# Patient Record
Sex: Female | Born: 1980 | Race: White | Hispanic: No | Marital: Single | Smoking: Current some day smoker
Health system: Southern US, Community
[De-identification: ages and names within clinical notes are randomized; demographics above are authoritative.]

## PROBLEM LIST (undated history)

## (undated) ENCOUNTER — Inpatient Hospital Stay (HOSPITAL_COMMUNITY): Payer: Self-pay

## (undated) DIAGNOSIS — J302 Other seasonal allergic rhinitis: Secondary | ICD-10-CM

## (undated) DIAGNOSIS — Z9889 Other specified postprocedural states: Secondary | ICD-10-CM

## (undated) DIAGNOSIS — Z8614 Personal history of Methicillin resistant Staphylococcus aureus infection: Secondary | ICD-10-CM

## (undated) DIAGNOSIS — M26629 Arthralgia of temporomandibular joint, unspecified side: Secondary | ICD-10-CM

## (undated) DIAGNOSIS — G5601 Carpal tunnel syndrome, right upper limb: Secondary | ICD-10-CM

## (undated) DIAGNOSIS — Z87442 Personal history of urinary calculi: Secondary | ICD-10-CM

## (undated) DIAGNOSIS — R203 Hyperesthesia: Secondary | ICD-10-CM

## (undated) DIAGNOSIS — R112 Nausea with vomiting, unspecified: Secondary | ICD-10-CM

## (undated) HISTORY — PX: TONSILLECTOMY: SUR1361

## (undated) HISTORY — PX: THERAPEUTIC ABORTION: SHX798

## (undated) HISTORY — PX: LUMBAR DISC SURGERY: SHX700

---
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
## 1999-07-06 ENCOUNTER — Other Ambulatory Visit: Admission: RE | Admit: 1999-07-06 | Discharge: 1999-07-06 | Payer: Self-pay | Admitting: Obstetrics and Gynecology

## 2000-05-12 ENCOUNTER — Encounter: Payer: Self-pay | Admitting: Emergency Medicine

## 2000-05-12 ENCOUNTER — Emergency Department (HOSPITAL_COMMUNITY): Admission: EM | Admit: 2000-05-12 | Discharge: 2000-05-12 | Payer: Self-pay | Admitting: Emergency Medicine

## 2000-08-28 ENCOUNTER — Other Ambulatory Visit: Admission: RE | Admit: 2000-08-28 | Discharge: 2000-08-28 | Payer: Self-pay | Admitting: Obstetrics and Gynecology

## 2001-09-07 ENCOUNTER — Other Ambulatory Visit: Admission: RE | Admit: 2001-09-07 | Discharge: 2001-09-07 | Payer: Self-pay | Admitting: Obstetrics and Gynecology

## 2002-10-12 ENCOUNTER — Other Ambulatory Visit: Admission: RE | Admit: 2002-10-12 | Discharge: 2002-10-12 | Payer: Self-pay | Admitting: Obstetrics and Gynecology

## 2004-06-18 ENCOUNTER — Other Ambulatory Visit: Admission: RE | Admit: 2004-06-18 | Discharge: 2004-06-18 | Payer: Self-pay | Admitting: Obstetrics and Gynecology

## 2004-07-17 ENCOUNTER — Emergency Department (HOSPITAL_COMMUNITY): Admission: AC | Admit: 2004-07-17 | Discharge: 2004-07-18 | Payer: Self-pay

## 2004-07-20 ENCOUNTER — Emergency Department (HOSPITAL_COMMUNITY): Admission: EM | Admit: 2004-07-20 | Discharge: 2004-07-20 | Payer: Self-pay | Admitting: Family Medicine

## 2004-07-23 ENCOUNTER — Emergency Department (HOSPITAL_COMMUNITY): Admission: EM | Admit: 2004-07-23 | Discharge: 2004-07-23 | Payer: Self-pay | Admitting: Family Medicine

## 2004-07-24 ENCOUNTER — Ambulatory Visit (HOSPITAL_BASED_OUTPATIENT_CLINIC_OR_DEPARTMENT_OTHER): Admission: RE | Admit: 2004-07-24 | Discharge: 2004-07-24 | Payer: Self-pay | Admitting: Orthopedic Surgery

## 2004-07-24 HISTORY — PX: ORIF METACARPAL FRACTURE: SUR940

## 2004-08-01 ENCOUNTER — Emergency Department (HOSPITAL_COMMUNITY): Admission: EM | Admit: 2004-08-01 | Discharge: 2004-08-01 | Payer: Self-pay | Admitting: Family Medicine

## 2005-06-13 ENCOUNTER — Other Ambulatory Visit: Admission: RE | Admit: 2005-06-13 | Discharge: 2005-06-13 | Payer: Self-pay | Admitting: Obstetrics and Gynecology

## 2006-03-24 ENCOUNTER — Emergency Department (HOSPITAL_COMMUNITY): Admission: EM | Admit: 2006-03-24 | Discharge: 2006-03-24 | Payer: Self-pay | Admitting: Emergency Medicine

## 2006-04-18 ENCOUNTER — Emergency Department (HOSPITAL_COMMUNITY): Admission: EM | Admit: 2006-04-18 | Discharge: 2006-04-18 | Payer: Self-pay | Admitting: Family Medicine

## 2006-05-15 ENCOUNTER — Emergency Department (HOSPITAL_COMMUNITY): Admission: EM | Admit: 2006-05-15 | Discharge: 2006-05-15 | Payer: Self-pay | Admitting: Emergency Medicine

## 2006-11-17 ENCOUNTER — Emergency Department (HOSPITAL_COMMUNITY): Admission: EM | Admit: 2006-11-17 | Discharge: 2006-11-18 | Payer: Self-pay | Admitting: Obstetrics and Gynecology

## 2006-11-18 ENCOUNTER — Inpatient Hospital Stay (HOSPITAL_COMMUNITY): Admission: EM | Admit: 2006-11-18 | Discharge: 2006-11-21 | Payer: Self-pay | Admitting: Psychiatry

## 2006-11-18 ENCOUNTER — Ambulatory Visit: Payer: Self-pay | Admitting: Psychiatry

## 2007-10-21 ENCOUNTER — Encounter (INDEPENDENT_AMBULATORY_CARE_PROVIDER_SITE_OTHER): Payer: Self-pay | Admitting: Family Medicine

## 2007-10-21 ENCOUNTER — Ambulatory Visit: Payer: Self-pay | Admitting: Internal Medicine

## 2007-10-21 LAB — CONVERTED CEMR LAB
ALT: 9 units/L (ref 0–35)
AST: 10 units/L (ref 0–37)
Albumin: 4.9 g/dL (ref 3.5–5.2)
Alkaline Phosphatase: 49 units/L (ref 39–117)
BUN: 16 mg/dL (ref 6–23)
Basophils Absolute: 0 10*3/uL (ref 0.0–0.1)
Basophils Relative: 1 % (ref 0–1)
CO2: 22 meq/L (ref 19–32)
Calcium: 10.1 mg/dL (ref 8.4–10.5)
Chloride: 106 meq/L (ref 96–112)
Creatinine, Ser: 0.79 mg/dL (ref 0.40–1.20)
Eosinophils Absolute: 0.1 10*3/uL (ref 0.0–0.7)
Eosinophils Relative: 2 % (ref 0–5)
Glucose, Bld: 89 mg/dL (ref 70–99)
HCT: 42.4 % (ref 36.0–46.0)
Hemoglobin: 14.6 g/dL (ref 12.0–15.0)
Lymphocytes Relative: 36 % (ref 12–46)
Lymphs Abs: 2.4 10*3/uL (ref 0.7–4.0)
MCHC: 34.4 g/dL (ref 30.0–36.0)
MCV: 91.8 fL (ref 78.0–100.0)
Monocytes Absolute: 0.6 10*3/uL (ref 0.1–1.0)
Monocytes Relative: 8 % (ref 3–12)
Neutro Abs: 3.5 10*3/uL (ref 1.7–7.7)
Neutrophils Relative %: 53 % (ref 43–77)
Platelets: 208 10*3/uL (ref 150–400)
Potassium: 4.6 meq/L (ref 3.5–5.3)
RBC: 4.62 M/uL (ref 3.87–5.11)
RDW: 13.1 % (ref 11.5–15.5)
Sodium: 140 meq/L (ref 135–145)
TSH: 1.448 microintl units/mL (ref 0.350–5.50)
Total Bilirubin: 0.3 mg/dL (ref 0.3–1.2)
Total Protein: 7.2 g/dL (ref 6.0–8.3)
WBC: 6.6 10*3/uL (ref 4.0–10.5)

## 2007-12-02 ENCOUNTER — Ambulatory Visit: Payer: Self-pay | Admitting: Internal Medicine

## 2007-12-02 ENCOUNTER — Encounter: Payer: Self-pay | Admitting: Family Medicine

## 2007-12-02 LAB — CONVERTED CEMR LAB
Chlamydia, DNA Probe: NEGATIVE
GC Probe Amp, Genital: NEGATIVE

## 2007-12-15 ENCOUNTER — Emergency Department (HOSPITAL_COMMUNITY): Admission: EM | Admit: 2007-12-15 | Discharge: 2007-12-15 | Payer: Self-pay | Admitting: Emergency Medicine

## 2007-12-16 ENCOUNTER — Ambulatory Visit: Payer: Self-pay | Admitting: *Deleted

## 2008-01-04 ENCOUNTER — Ambulatory Visit: Payer: Self-pay | Admitting: Internal Medicine

## 2008-01-11 ENCOUNTER — Ambulatory Visit: Payer: Self-pay | Admitting: Internal Medicine

## 2008-02-25 ENCOUNTER — Emergency Department (HOSPITAL_COMMUNITY): Admission: EM | Admit: 2008-02-25 | Discharge: 2008-02-25 | Payer: Self-pay | Admitting: Family Medicine

## 2008-03-08 ENCOUNTER — Ambulatory Visit: Payer: Self-pay | Admitting: Internal Medicine

## 2008-03-28 ENCOUNTER — Emergency Department (HOSPITAL_COMMUNITY): Admission: EM | Admit: 2008-03-28 | Discharge: 2008-03-28 | Payer: Self-pay | Admitting: Family Medicine

## 2008-04-27 ENCOUNTER — Ambulatory Visit: Payer: Self-pay | Admitting: Internal Medicine

## 2008-10-07 ENCOUNTER — Ambulatory Visit: Payer: Self-pay | Admitting: Internal Medicine

## 2008-11-01 ENCOUNTER — Emergency Department (HOSPITAL_COMMUNITY): Admission: EM | Admit: 2008-11-01 | Discharge: 2008-11-01 | Payer: Self-pay | Admitting: Family Medicine

## 2008-12-01 ENCOUNTER — Encounter: Payer: Self-pay | Admitting: Family Medicine

## 2008-12-01 ENCOUNTER — Ambulatory Visit: Payer: Self-pay | Admitting: Internal Medicine

## 2008-12-27 ENCOUNTER — Emergency Department (HOSPITAL_COMMUNITY): Admission: EM | Admit: 2008-12-27 | Discharge: 2008-12-27 | Payer: Self-pay | Admitting: Family Medicine

## 2009-07-07 ENCOUNTER — Emergency Department (HOSPITAL_COMMUNITY): Admission: EM | Admit: 2009-07-07 | Discharge: 2009-07-07 | Payer: Self-pay | Admitting: Emergency Medicine

## 2009-11-13 ENCOUNTER — Emergency Department (HOSPITAL_COMMUNITY): Admission: EM | Admit: 2009-11-13 | Discharge: 2009-11-13 | Payer: Self-pay | Admitting: Family Medicine

## 2010-09-25 ENCOUNTER — Other Ambulatory Visit: Payer: Self-pay | Admitting: Surgery

## 2010-11-06 ENCOUNTER — Other Ambulatory Visit: Payer: Self-pay | Admitting: Obstetrics and Gynecology

## 2010-12-17 ENCOUNTER — Emergency Department (INDEPENDENT_AMBULATORY_CARE_PROVIDER_SITE_OTHER): Payer: BC Managed Care – PPO

## 2010-12-17 ENCOUNTER — Emergency Department (HOSPITAL_BASED_OUTPATIENT_CLINIC_OR_DEPARTMENT_OTHER)
Admission: EM | Admit: 2010-12-17 | Discharge: 2010-12-17 | Disposition: A | Discharge status: Home / Self Care | Payer: BC Managed Care – PPO | Attending: Emergency Medicine | Admitting: Emergency Medicine

## 2010-12-17 DIAGNOSIS — M545 Low back pain, unspecified: Secondary | ICD-10-CM | POA: Insufficient documentation

## 2010-12-17 DIAGNOSIS — N200 Calculus of kidney: Secondary | ICD-10-CM

## 2010-12-17 DIAGNOSIS — Z87442 Personal history of urinary calculi: Secondary | ICD-10-CM

## 2010-12-17 DIAGNOSIS — R109 Unspecified abdominal pain: Secondary | ICD-10-CM

## 2010-12-17 LAB — PREGNANCY, URINE: Preg Test, Ur: NEGATIVE

## 2010-12-17 LAB — URINALYSIS, ROUTINE W REFLEX MICROSCOPIC
Hgb urine dipstick: NEGATIVE
Ketones, ur: NEGATIVE mg/dL
Nitrite: NEGATIVE
Protein, ur: NEGATIVE mg/dL
Urobilinogen, UA: 0.2 mg/dL (ref 0.0–1.0)
pH: 7 (ref 5.0–8.0)
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
## 2010-12-21 NOTE — H&P (Signed)
Paula Sosa, Paula Sosa                ACCOUNT NO.:  [id number]   MEDICAL RECORD NO.:  [id number]          PATIENT TYPE:  IPS   LOCATION:  0505                          [facility]ILITY[facility]   PHYSICIAN:  Vic Ripper, P.A.-C.DATE OF BIRTH:  1981-02-06   DATE OF ADMISSION:  11/18/2006  DATE OF DISCHARGE:                       PSYCHIATRIC ADMISSION ASSESSMENT   PSYCHIATRIC ADMISSION ASSESSMENT:  This is a voluntary admission to the  services of Dr. Geoffery Lyons.   IDENTIFYING INFORMATION:  This is a 30 year old single white female.  She presented to the emergency department at [facility]  requesting detox from alcohol, cocaine and benzodiazepines.  She stated  that she had last drank about 6 p.m. yesterday, she usually drinks 3-4  beers and 6-7 shots every night.  She stated that she is not sure if she  has a chemical imbalance because she began using drugs and alcohol at  age 53.  Yesterday she realized that she does not own anything, she does  not have any prospects of a future, and she called her mother and stated  that she was ready to get help.  A cousin of hers had gone to the  program at [facility] back in the 1980s, and they went over to  [employer] and interviewed.  They were advised that she needed to  be detoxed, and hence she came here to [facility].   PAST PSYCHIATRIC HISTORY:  She attempted outpatient counseling in 2005  at the [facility].  She did have several trials of SSRIs; however,  she did not take them and she was drinking at the time, so she never  felt that they were helpful.  Three months ago she had some outpatient  therapy with a Vernell Leep.   SOCIAL HISTORY:  She graduated high school in 1998.  She never married.  She has no children.  She waits tables.   FAMILY HISTORY:  Her father and paternal grandfather had substance abuse  issues.   Her own alcohol and drug history begins at age 44.  She began using  alcohol.  She currently  drinks as described above.  She also began using  cocaine and benzodiazepines at age 29.  She is currently using cocaine  about four times in the past 2-1/2 months, and Xanax she generally uses  three Xanax 2 mg a day, so that is a pretty high dose.   PAST MEDICAL HISTORY:  Primary care Verdis Bassette is Dr. Catha Gosselin.  Medical problems:  She has chronic back pain and kidney stones.   MEDICATIONS:  None.   DRUG ALLERGIES:  PENICILLIN.   POSITIVE PHYSICAL FINDINGS:  She was medically cleared in the ED.  She  had no remarkable physical findings other than a slightly elevated  glucose at 123.  She does admit to not eating correctly.  Her vital  signs on admission show she is 68 inches tall, weighs 141, temperature  97.7, blood pressure 121/79, pulse 71-80, respirations 18.  She has five  tattoos.  Please see anatomic drawing.  Left hand:  She had to have six  screws  in 2005.  She has had a tonsillectomy.  She has also been treated  for genital warts two years ago.   MENTAL STATUS EXAMINATION:  Today she is alert and oriented x3.  She is  casually dressed, appropriately groomed and nourished.  Her mood is  appropriate to the situation.  Her affect is normal range.  Thought  processes are clear, rational and goal oriented.  Judgment and insight  are intact.  Concentration and memory are intact.  Intelligence is at  least average.  She is denying suicidal or homicidal ideation.  She is  denying auditory or visual hallucinations.   DIAGNOSES:  AXIS I:  Depressive disorder, not other specified;  polysubstance abuse.  AXIS II:  Deferred.  AXIS III:  Chronic back pain and currently has an elevated blood sugar.  AXIS IV:  Primary support and financial.  AXIS V:  35.   PLAN:  To admit to support through detoxification.  Toward that end she  was given Librium 25 mg p.o. q.6h. p.r.n. withdrawal, but given her  substantial Xanax usage, we may need to have clonidine added in later  on.  Currently  she is not exhibiting any withdrawal.  Once she is  through with withdrawal, she plans to be admitted to the [facility].  She has already had an entry interview.      Vic Ripper, P.A.-C.     MD/MEDQ  D:  11/18/2006  T:  11/18/2006  Job:  [id number]

## 2010-12-21 NOTE — Discharge Summary (Signed)
Sosa, Paula                ACCOUNT NO.:  [id number]   MEDICAL RECORD NO.:  [id number]          PATIENT TYPE:  IPS   LOCATION:  0505                          FACILITY:  [facility]   PHYSICIAN:  Geoffery Lyons, M.D.      DATE OF BIRTH:  [birth year]   DATE OF ADMISSION:  11/18/2006  DATE OF DISCHARGE:  11/21/2006                               DISCHARGE SUMMARY   CHIEF COMPLAINT AND PRESENT ILLNESS:  This was the first admission to  [facility] for this 30 year old single white female  who presented to the emergency department at [facility] requesting  detox from alcohol, cocaine and benzodiazepines.  She has had last drink  about 6 p.m. the day before this admission, usually drinking 3-4 beers  and 6-7 shots every night.  Stated that she was not sure if she had a  chemical imbalance because she began using drugs and alcohol at age 2.  Realized she did not own anything.  Does not have any prospect of a  future.  She called her mother and stated that she was ready to get  help.  A cousin of hers has gone through [employer] back in the  80s.  They went to [employer] for an interview.  She was advised  she needed to be detoxed first before being considered for their  program.   PAST PSYCHIATRIC HISTORY:  Attempted outpatient counseling in 2005 at  the [facility].  Several trials of SSRIs.  Did not take them.  She  was drinking at that time.   ALCOHOL/DRUG HISTORY:  Persistent use of alcohol.  Also began using  cocaine and benzodiazepines at age 1.  Using cocaine four times in the  past 2-1/2 months and uses 3 Xanax 2 mg a day.  Not prescribed.   MEDICAL HISTORY:  Chronic back pain and kidney stone.   MEDICATIONS:  None.   PHYSICAL EXAMINATION:  Performed and failed to show any acute findings.   LABORATORY WORK:  Glucose 108, hemoglobin A1C 5.5.   MENTAL STATUS EXAM:  Fully alert cooperative female casually dressed,  appropriately groomed and nourished.   Mood is appropriate.  Mood is  anxious.  Affect is anxious.  Thought processes are clear, rational and  goal-oriented.  No evidence of delusions.  No active suicidal or  homicidal ideation.  No hallucinations.  Cognition well-preserved.   ADMISSION DIAGNOSES:  AXIS I:  Depressive disorder not otherwise  specified.  Alcohol and benzodiazepine dependence.  Cocaine abuse.  AXIS II:  No diagnosis.  AXIS III:  Chronic back pain.  AXIS IV:  Moderate.  AXIS V:  GAF upon admission 35; highest GAF in the last year 65.   HOSPITAL COURSE:  She was admitted.  She was started in individual and  group psychotherapy.  She was detoxified with Librium.  She endorsed  using alcohol and drugs for 10 years, on and off, cocaine, pain pills,  Xanax, alcohol.  Told her mother she could not do it anymore.  Sunday  night, got all messed up.  At 30 years old,  started using cocaine and  Xanax.  Living with roommates in [place].  Parents in [place].  Works in bars, [employer].  In 2004 at the [facility].  Has seen Tiana Loft for depression.  She requests admission to [facility] and  she was asked to be detoxed before they were considering her.  So we  pursued the detoxification.  On November 20, 2006, she learned that she was  going to be accepted.  Felt that she was not going to able to make it  just by going to a [facility] IOP.  Felt that she needed the structure and there  was a previous family member who did well on the [employer].  On November 21, 2006, she was in full contact with reality.  There were no active suicidal or homicidal ideation.  No hallucinations.  No delusions.  First, she said she was ready to go, was going to go  directly to the street, worked towards getting admitted there, will  pursue long-term treatment for addiction.  The goal is long-term  abstinence recovery.   DISCHARGE DIAGNOSES:  AXIS I:  Alcohol and benzodiazepine dependence.  Cocaine abuse.  Depressive  disorder not otherwise specified.  AXIS II:  No diagnosis.  AXIS III:  History of chronic back pain.  AXIS IV:  Moderate.  AXIS V:  GAF upon discharge 50-55.   DISCHARGE MEDICATIONS:  None.   FOLLOWUP:  To pursue residential treatment through [employer]  program.      Geoffery Lyons, M.D.  Electronically Signed     IL/MEDQ  D:  12/17/2006  T:  12/17/2006  Job:  [id number]

## 2011-04-21 ENCOUNTER — Encounter: Payer: Self-pay | Admitting: *Deleted

## 2011-04-21 ENCOUNTER — Emergency Department (HOSPITAL_BASED_OUTPATIENT_CLINIC_OR_DEPARTMENT_OTHER)
Admission: EM | Admit: 2011-04-21 | Discharge: 2011-04-21 | Disposition: A | Discharge status: Home / Self Care | Payer: Self-pay | Attending: Emergency Medicine | Admitting: Emergency Medicine

## 2011-04-21 DIAGNOSIS — F329 Major depressive disorder, single episode, unspecified: Secondary | ICD-10-CM | POA: Insufficient documentation

## 2011-04-21 DIAGNOSIS — F3289 Other specified depressive episodes: Secondary | ICD-10-CM | POA: Insufficient documentation

## 2011-04-21 DIAGNOSIS — M25529 Pain in unspecified elbow: Secondary | ICD-10-CM | POA: Insufficient documentation

## 2011-04-21 DIAGNOSIS — M7710 Lateral epicondylitis, unspecified elbow: Secondary | ICD-10-CM | POA: Insufficient documentation

## 2011-04-21 MED ORDER — IBUPROFEN 400 MG PO TABS
600.0000 mg | ORAL_TABLET | Freq: Once | ORAL | Status: AC
  Administered 2011-04-21: 600 mg via ORAL
  Filled 2011-04-21: qty 1

## 2011-04-21 MED ORDER — PREDNISONE 20 MG PO TABS
60.0000 mg | ORAL_TABLET | Freq: Once | ORAL | Status: AC
  Administered 2011-04-21: 60 mg via ORAL
  Filled 2011-04-21: qty 3

## 2011-04-21 NOTE — ED Provider Notes (Signed)
History     CSN: [id number] Arrival date & time: 04/21/2011  3:02 PM Scribed for Hilario Quarry, MD, the patient was seen in room [facility]1/[facility]. This chart was scribed by Katha Cabal.    Chief Complaint  Patient presents with  . Elbow Injury    HPI  Paula Sosa is a 30 y.o. female who presents to the Emergency Department complaining of gradual worsening of right elbow pain since onset yesterday.  Denies injury.  Patient states that she was helping her parents move and was lifting objects frequently.  No pain medications where taken at home.  Ice provided by ED RN is providing some relief.   PCP [facility]    PAST MEDICAL HISTORY:  Past Medical History  Diagnosis Date  . Depression     PAST SURGICAL HISTORY:  Past Surgical History  Procedure Date  . Hand surgery     FAMILY HISTORY:  No family history on file.   SOCIAL HISTORY: History   Social History  . Marital Status: Single    Spouse Name: N/A    Number of Children: N/A  . Years of Education: N/A   Social History Main Topics  . Smoking status: Never Smoker   . Smokeless tobacco: None  . Alcohol Use: No  . Drug Use: Yes  . Sexually Active: Yes    Birth Control/ Protection: None   Other Topics Concern  . None   Social History Narrative  . None    Review of Systems 10 Systems reviewed and are negative for acute change except as noted in the HPI.  Allergies  Penicillins  Home Medications   Current Outpatient Rx  Name Route Sig Dispense Refill  . BUPROPION HCL (XL) 300 MG PO TB24 Oral Take 300 mg by mouth daily.      . IBUPROFEN 200 MG PO TABS Oral Take 400 mg by mouth every 6 (six) hours as needed. pain     . SERTRALINE HCL 100 MG PO TABS Oral Take 100 mg by mouth daily.      Marland Kitchen VISINE OP Both Eyes Place 1 drop into both eyes daily as needed. Itchy eyes       Physical Exam    BP 126/76  Pulse 106  Temp(Src) 98.6 F (37 C) (Oral)  Resp 20  Ht 5\' 9"  (1.753 m)  Wt 150 lb (68.04 kg)   BMI 22.15 kg/m2  SpO2 100%  LMP 04/07/2011  Physical Exam  Nursing note and vitals reviewed. Constitutional: She is oriented to person, place, and time. She appears well-developed and well-nourished.  HENT:  Head: Normocephalic and atraumatic.  Eyes: EOM are normal. Pupils are equal, round, and reactive to light.  Neck: Normal range of motion. Neck supple.  Cardiovascular: Normal rate and intact distal pulses.        Radial pulse 2+  Pulmonary/Chest: Effort normal. No respiratory distress.  Abdominal: Soft. There is no tenderness.  Musculoskeletal: Normal range of motion.       Tender over the lateral epicondyl of the right elbow.   Neurological: She is alert and oriented to person, place, and time.  Skin: Skin is warm and dry. No erythema.  Psychiatric: She has a normal mood and affect. Her behavior is normal.    ED Course  Procedures   OTHER DATA REVIEWED: Nursing notes, vital signs, and past medical records reviewed.   DIAGNOSTIC STUDIES: Oxygen Saturation is 100% on room air, normal by my interpretation.  LABS / RADIOLOGY:   No results found.   ED COURSE / COORDINATION OF CARE:  Orders Placed This Encounter  Procedures  . Apply sling arm foam    MDM:   Physical Exam complete. Will order pain control and antiinflammatory medications.  Right arm is to be placed in sling.  Patient is right handed.   IMPRESSION: Diagnoses that have been ruled out:  Diagnoses that are still under consideration:  Final diagnoses:  Lateral epicondylitis     MEDICATIONS GIVEN IN THE E.D. Scheduled Meds:   . ibuprofen  600 mg Oral Once  . predniSONE  60 mg Oral Once   Continuous Infusions:     DISCHARGE MEDICATIONS: New Prescriptions   No medications on file     I personally performed the services described in this documentation, which was scribed in my presence. The recorded information has been reviewed and considered. No att. providers  found         Hilario Quarry, MD 04/23/11 1246

## 2011-04-21 NOTE — ED Notes (Signed)
Ice pack given for home use- no rx given

## 2011-04-21 NOTE — ED Notes (Signed)
Pt states she was helping her parents move yesterday and was doing a lot of lifting. Now C/O pain to the right elbow. Denies injury.

## 2011-05-26 ENCOUNTER — Emergency Department (HOSPITAL_BASED_OUTPATIENT_CLINIC_OR_DEPARTMENT_OTHER)
Admission: EM | Admit: 2011-05-26 | Discharge: 2011-05-26 | Disposition: A | Discharge status: Home / Self Care | Payer: Self-pay | Attending: Emergency Medicine | Admitting: Emergency Medicine

## 2011-05-26 ENCOUNTER — Emergency Department (INDEPENDENT_AMBULATORY_CARE_PROVIDER_SITE_OTHER): Payer: Self-pay

## 2011-05-26 ENCOUNTER — Encounter (HOSPITAL_BASED_OUTPATIENT_CLINIC_OR_DEPARTMENT_OTHER): Payer: Self-pay | Admitting: *Deleted

## 2011-05-26 DIAGNOSIS — F329 Major depressive disorder, single episode, unspecified: Secondary | ICD-10-CM | POA: Insufficient documentation

## 2011-05-26 DIAGNOSIS — IMO0001 Reserved for inherently not codable concepts without codable children: Secondary | ICD-10-CM

## 2011-05-26 DIAGNOSIS — M791 Myalgia, unspecified site: Secondary | ICD-10-CM

## 2011-05-26 DIAGNOSIS — J069 Acute upper respiratory infection, unspecified: Secondary | ICD-10-CM | POA: Insufficient documentation

## 2011-05-26 DIAGNOSIS — F3289 Other specified depressive episodes: Secondary | ICD-10-CM | POA: Insufficient documentation

## 2011-05-26 DIAGNOSIS — R05 Cough: Secondary | ICD-10-CM

## 2011-05-26 LAB — URINALYSIS, ROUTINE W REFLEX MICROSCOPIC
Bilirubin Urine: NEGATIVE
Ketones, ur: NEGATIVE mg/dL
Leukocytes, UA: NEGATIVE
Nitrite: NEGATIVE
Protein, ur: NEGATIVE mg/dL
Specific Gravity, Urine: 1.02 (ref 1.005–1.030)
Urobilinogen, UA: 0.2 mg/dL (ref 0.0–1.0)
pH: 7.5 (ref 5.0–8.0)

## 2011-05-26 LAB — PREGNANCY, URINE: Preg Test, Ur: NEGATIVE

## 2011-05-26 LAB — URINE MICROSCOPIC-ADD ON

## 2011-05-26 MED ORDER — SULFAMETHOXAZOLE-TRIMETHOPRIM 800-160 MG PO TABS: 1.0000 | ORAL_TABLET | Freq: Two times a day (BID) | ORAL | Status: AC

## 2011-05-26 MED ORDER — OXYCODONE-ACETAMINOPHEN 5-325 MG PO TABS
1.0000 | ORAL_TABLET | Freq: Once | ORAL | Status: AC
  Administered 2011-05-26: 1 via ORAL
  Filled 2011-05-26: qty 1

## 2011-05-26 MED ORDER — NAPROXEN 500 MG PO TABS: 500.0000 mg | ORAL_TABLET | Freq: Two times a day (BID) | ORAL | Status: DC

## 2011-05-26 MED ORDER — HYDROCODONE-ACETAMINOPHEN 5-325 MG PO TABS: 1.0000 | ORAL_TABLET | Freq: Four times a day (QID) | ORAL | Status: AC | PRN

## 2011-05-26 NOTE — ED Notes (Signed)
Patient states yesterday back was hurting, got worse and this morning having neck pain and body aches, and cough

## 2011-05-26 NOTE — ED Provider Notes (Signed)
History     CSN: [id number] Arrival date & time: 05/26/2011  6:57 AM   First MD Initiated Contact with Patient 05/26/11 0701      Chief Complaint  Patient presents with  . Influenza    (Consider location/radiation/quality/duration/timing/severity/associated sxs/prior treatment) HPI Patient states yesterday she started having some back ache. She developed a slight cough as well. This morning she started having a lot more myalgias.  She has not had any sore throat or earaches. She's not had any vomiting or abdominal pain. No dysuria or frequency. Patient states she felt really lousy this morning and felt like she had the flu. Symptoms have been getting gradually worse.  The severity is moderate to severe. Patient's just taking over-the-counter medications at home.  Past Medical History  Diagnosis Date  . Depression     Past Surgical History  Procedure Date  . Hand surgery   . Hand surgery   . Tonsillectomy     No family history on file.  History  Substance Use Topics  . Smoking status: Never Smoker   . Smokeless tobacco: Not on file  . Alcohol Use: No    OB History    Grav Para Term Preterm Abortions TAB SAB Ect Mult Living                  Review of Systems  All other systems reviewed and are negative.    Allergies  Penicillins  Home Medications   Current Outpatient Rx  Name Route Sig Dispense Refill  . BUPROPION HCL ER (XL) 300 MG PO TB24 Oral Take 300 mg by mouth daily.      . IBUPROFEN 200 MG PO TABS Oral Take 400 mg by mouth every 6 (six) hours as needed. pain     . SERTRALINE HCL 100 MG PO TABS Oral Take 100 mg by mouth daily.      Marland Kitchen VISINE OP Both Eyes Place 1 drop into both eyes daily as needed. Itchy eyes       BP 115/70  Pulse 90  Temp 98.8 F (37.1 C)  Resp 20  SpO2 99%  LMP 05/12/2011  Physical Exam  Nursing note and vitals reviewed. Constitutional: She appears well-developed and well-nourished. No distress.  HENT:  Head:  Normocephalic and atraumatic.  Right Ear: External ear normal.  Left Ear: External ear normal.  Eyes: Conjunctivae are normal. Right eye exhibits no discharge. Left eye exhibits no discharge. No scleral icterus.  Neck: Neck supple. No tracheal deviation present.       No meningismus  Cardiovascular: Normal rate, regular rhythm and intact distal pulses.   Pulmonary/Chest: Effort normal and breath sounds normal. No stridor. No respiratory distress. She has no wheezes. She has no rales.       Occasional cough  Abdominal: Soft. Bowel sounds are normal. She exhibits no distension. There is no tenderness. There is no rebound and no guarding.  Musculoskeletal: She exhibits no edema and no tenderness.       No CVA tenderness  Neurological: She is alert. She has normal strength. No sensory deficit. Cranial nerve deficit:  no gross defecits noted. She exhibits normal muscle tone. She displays no seizure activity. Coordination normal.  Skin: Skin is warm and dry. No rash noted.       No petechiae or purpura  Psychiatric: She has a normal mood and affect.    ED Course  Procedures (including critical care time)  Labs Reviewed  URINALYSIS, ROUTINE W REFLEX MICROSCOPIC -  Abnormal; Notable for the following:    Hgb urine dipstick TRACE (*)    All other components within normal limits  URINE MICROSCOPIC-ADD ON - Abnormal; Notable for the following:    Bacteria, UA MANY (*)    All other components within normal limits  PREGNANCY, URINE   Dg Chest 2 View  05/26/2011  *RADIOLOGY REPORT*  Clinical Data: Cough and myalgis  CHEST - 2 VIEW  Comparison: None.  Findings: Normal mediastinum and cardiac silhouette.  Normal pulmonary  vasculature.  No evidence of effusion, infiltrate, or pneumothorax.  No acute bony abnormality.  IMPRESSION: Normal chest radiograph  Original Report Authenticated By: Genevive Bi, M.D.     1. Myalgia   2. URI, acute       MDM  Patient has constellation of symptoms  that most likely is associated with a viral illness. However, she does have significant bacteria in the urine. There is no leukocyte esterase or nitrite or leukocytes in the urine to suggest a definitive urinary tract infection. I discussed these findings with the patient indicated we'll do a urine culture. At this point she preferred to get the prescription for antibiotics in case she does have a early kidney infection. Patient was instructed to return to emergency room for worsening symptoms and to follow up with primary care doctor she has not had resolution of her symptoms next week. At this time I don't feel there is any evidence suggest pneumonia, meningitis.        Celene Kras, MD 05/26/11 [phone]

## 2011-05-28 LAB — URINE CULTURE: Culture: NO GROWTH

## 2011-09-26 ENCOUNTER — Encounter (HOSPITAL_BASED_OUTPATIENT_CLINIC_OR_DEPARTMENT_OTHER): Payer: Self-pay

## 2011-09-26 ENCOUNTER — Emergency Department (INDEPENDENT_AMBULATORY_CARE_PROVIDER_SITE_OTHER): Payer: Self-pay

## 2011-09-26 ENCOUNTER — Emergency Department (HOSPITAL_BASED_OUTPATIENT_CLINIC_OR_DEPARTMENT_OTHER)
Admission: EM | Admit: 2011-09-26 | Discharge: 2011-09-26 | Disposition: A | Discharge status: Home / Self Care | Payer: Self-pay | Attending: Emergency Medicine | Admitting: Emergency Medicine

## 2011-09-26 ENCOUNTER — Other Ambulatory Visit: Payer: Self-pay

## 2011-09-26 DIAGNOSIS — F411 Generalized anxiety disorder: Secondary | ICD-10-CM | POA: Insufficient documentation

## 2011-09-26 DIAGNOSIS — R0602 Shortness of breath: Secondary | ICD-10-CM | POA: Insufficient documentation

## 2011-09-26 DIAGNOSIS — F419 Anxiety disorder, unspecified: Secondary | ICD-10-CM

## 2011-09-26 DIAGNOSIS — R079 Chest pain, unspecified: Secondary | ICD-10-CM

## 2011-09-26 DIAGNOSIS — R0789 Other chest pain: Secondary | ICD-10-CM | POA: Insufficient documentation

## 2011-09-26 MED ORDER — LORAZEPAM 1 MG PO TABS: 1.0000 mg | ORAL_TABLET | Freq: Three times a day (TID) | ORAL | Status: AC | PRN

## 2011-09-26 NOTE — ED Notes (Signed)
C/o "trouble getting a deep breathe-chest tightness" x 1week

## 2011-09-26 NOTE — Discharge Instructions (Signed)
Anxiety and Panic Attacks Anxiety is your body's way of reacting to real danger or something you think is a danger. It may be fear or worry over a situation like losing your job. Sometimes the cause is not known. A panic attack is made up of physical signs like sweating, shaking, or chest pain. Anxiety and panic attacks may start suddenly. They may be strong. They may come at any time of day, even while sleeping. They may come at any time of life. Panic attacks are scary, but they do not harm you physically.  HOME CARE  Avoid any known causes of your anxiety.   Try to relax. Yoga may help. Tell yourself everything will be okay.   Exercise often.   Get expert advice and help (therapy) to stop anxiety or attacks from happening.   Avoid caffeine, alcohol, and drugs.   Only take medicine as told by your doctor.  GET HELP RIGHT AWAY IF:  Your attacks seem different than normal attacks.   Your problems are getting worse or concern you.  MAKE SURE YOU:  Understand these instructions.   Will watch your condition.   Will get help right away if you are not doing well or get worse.  Document Released: 08/24/2010 Document Revised: 04/03/2011 Document Reviewed: 08/24/2010 [facility] Patient Information 2012 [place], [place].Chest Pain (Nonspecific) Chest pain has many causes. Your pain could be caused by something serious, such as a heart attack or a blood clot in the lungs. It could also be caused by something less serious, such as a chest bruise or a virus. Follow up with your doctor. More lab tests or other studies may be needed to find the cause of your pain. Most of the time, nonspecific chest pain will improve within 2 to 3 days of rest and mild pain medicine. HOME CARE  For chest bruises, you may put ice on the sore area for 15 to 20 minutes, 3 to 4 times a day. Do this only if it makes you or your child feel better.   Put ice in a plastic bag.   Place a towel between the skin and the bag.    Rest for the next 2 to 3 days.   Go back to work if the pain improves.   See your doctor if the pain lasts longer than 1 to 2 weeks.   Only take medicine as told by your doctor.   Quit smoking if you smoke.  GET HELP RIGHT AWAY IF:   There is more pain or pain that spreads to the arm, neck, jaw, back, or belly (abdomen).   You or your child has shortness of breath.   You or your child coughs more than usual or coughs up blood.   You or your child has very bad back or belly pain, feels sick to his or her stomach (nauseous), or throws up (vomits).   You or your child has very bad weakness.   You or your child passes out (faints).   You or your child has a temperature by mouth above 102 F (38.9 C), not controlled by medicine.  MAKE SURE YOU:   Understand these instructions.   Will watch this condition.   Will get help right away if you or your child is not doing well or gets worse.  Document Released: 01/08/2008 Document Revised: 04/03/2011 Document Reviewed: 01/08/2008 [facility] Patient Information 2012 [place], [place].

## 2011-09-26 NOTE — ED Provider Notes (Signed)
History     CSN: [id number]  Arrival date & time 09/26/11  1538   First MD Initiated Contact with Patient 09/26/11 1630      Chief Complaint  Patient presents with  . Shortness of Breath  . Chest Pain    (Consider location/radiation/quality/duration/timing/severity/associated sxs/prior treatment) HPI Comments: Patient presents with one-week history of feeling that her chest is tight and she has a hard time getting a good breath in. She is also complaining of a lot of anxiety over the last 2 weeks. At that point where she has a hard time leaving her house due to anxiety. She feels like a lot of the symptoms are due to her anxiety. She's had a history of anxiety in the past, but not to this extent. She denies any depression or suicidal ideations. She does have an appointment to see a psychologist in [place] tomorrow. She's been on antidepressants for anxiety but never any benzodiazepines. She denies any cough congestion fevers or chest congestion. Denies any leg pain or swelling. Denies any pleuritic-type pain.  Patient is a 31 y.o. female presenting with shortness of breath and chest pain. The history is provided by the patient.  Shortness of Breath  Associated symptoms include chest pain and shortness of breath. Pertinent negatives include no fever, no rhinorrhea and no cough.  Chest Pain Primary symptoms include shortness of breath. Pertinent negatives for primary symptoms include no fever, no fatigue, no cough, no abdominal pain, no nausea, no vomiting and no dizziness.  Pertinent negatives for associated symptoms include no diaphoresis, no numbness and no weakness.     Past Medical History  Diagnosis Date  . Depression     Past Surgical History  Procedure Date  . Hand surgery   . Hand surgery   . Tonsillectomy     No family history on file.  History  Substance Use Topics  . Smoking status: Never Smoker   . Smokeless tobacco: Not on file  . Alcohol Use: No    OB  History    Grav Para Term Preterm Abortions TAB SAB Ect Mult Living                  Review of Systems  Constitutional: Negative for fever, chills, diaphoresis and fatigue.  HENT: Negative for congestion, rhinorrhea and sneezing.   Eyes: Negative.   Respiratory: Positive for shortness of breath. Negative for cough and chest tightness.   Cardiovascular: Positive for chest pain. Negative for leg swelling.  Gastrointestinal: Negative for nausea, vomiting, abdominal pain, diarrhea and blood in stool.  Genitourinary: Negative for frequency, hematuria, flank pain and difficulty urinating.  Musculoskeletal: Negative for back pain and arthralgias.  Skin: Negative for rash.  Neurological: Negative for dizziness, speech difficulty, weakness, numbness and headaches.  Psychiatric/Behavioral: Negative for suicidal ideas. The patient is nervous/anxious.     Allergies  Penicillins  Home Medications   Current Outpatient Rx  Name Route Sig Dispense Refill  . IBUPROFEN 200 MG PO TABS Oral Take 400 mg by mouth every 6 (six) hours as needed. For pain    . OVER THE COUNTER MEDICATION Oral Take 2 tablets by mouth once as needed. Hylan's Calmforte for sleep    . SERTRALINE HCL 100 MG PO TABS Oral Take 100 mg by mouth daily.      Marland Kitchen VISINE OP Both Eyes Place 1 drop into both eyes daily as needed. Itchy eyes    . LORAZEPAM 1 MG PO TABS Oral Take 1 tablet (1  mg total) by mouth 3 (three) times daily as needed for anxiety. 10 tablet 0    BP 120/87  Pulse 95  Temp(Src) 98 F (36.7 C) (Oral)  Resp 18  Ht 5\' 8"  (1.727 m)  Wt 150 lb (68.04 kg)  BMI 22.81 kg/m2  SpO2 100%  LMP 09/18/2011  Physical Exam  Constitutional: She is oriented to person, place, and time. She appears well-developed and well-nourished.       Patient is tearful and anxious appearing.  HENT:  Head: Normocephalic and atraumatic.  Eyes: Pupils are equal, round, and reactive to light.  Neck: Normal range of motion. Neck supple.    Cardiovascular: Normal rate, regular rhythm and normal heart sounds.   Pulmonary/Chest: Effort normal and breath sounds normal. No respiratory distress. She has no wheezes. She has no rales. She exhibits no tenderness.  Abdominal: Soft. Bowel sounds are normal. There is no tenderness. There is no rebound and no guarding.  Musculoskeletal: Normal range of motion. She exhibits no edema.  Lymphadenopathy:    She has no cervical adenopathy.  Neurological: She is alert and oriented to person, place, and time.  Skin: Skin is warm and dry. No rash noted.  Psychiatric: She has a normal mood and affect.    ED Course  Procedures (including critical care time)  Labs Reviewed - No data to display Dg Chest 2 View  09/26/2011  *RADIOLOGY REPORT*  Clinical Data: Chest tightness and short of breath.  CHEST - 2 VIEW  Comparison: 05/26/2011  Findings: Normal heart size.  Clear lungs.  No pneumothorax and no pleural effusion.  IMPRESSION: No active cardiopulmonary disease.  Original Report Authenticated By: Donavan Burnet, M.D.    Date: 09/26/2011  Rate: 93  Rhythm: normal sinus rhythm  QRS Axis: normal  Intervals: normal  ST/T Wave abnormalities: normal  Conduction Disutrbances:none  Narrative Interpretation:   Old EKG Reviewed: unchanged    1. Anxiety   2. Chest tightness       MDM  Patient with normal EKG normal chest x-ray. There is nothing on exam or history to suggest pulmonary embolus. Doubt this is cardiac. I feel that this is largely related related to her anxiety and panic attacks. She did not appear to have any significant depression or suicidal ideations. Does not appear to be a harm to herself. She has appointment to follow up with a psychologist tomorrow. We'll try a short course of benzodiazepines for anxiety as she says she hasn't been able get any sleep at night. Have her follow up tomorrow with her psychologist. Eyes are followed with her primary care physician if her chest  tightness continues        Rolan Bucco, MD 09/26/11 1743

## 2012-08-07 ENCOUNTER — Emergency Department (HOSPITAL_BASED_OUTPATIENT_CLINIC_OR_DEPARTMENT_OTHER)
Admission: EM | Admit: 2012-08-07 | Discharge: 2012-08-07 | Disposition: A | Discharge status: Home / Self Care | Payer: Self-pay | Attending: Emergency Medicine | Admitting: Emergency Medicine

## 2012-08-07 ENCOUNTER — Encounter (HOSPITAL_BASED_OUTPATIENT_CLINIC_OR_DEPARTMENT_OTHER): Payer: Self-pay | Admitting: *Deleted

## 2012-08-07 DIAGNOSIS — F3289 Other specified depressive episodes: Secondary | ICD-10-CM | POA: Insufficient documentation

## 2012-08-07 DIAGNOSIS — F329 Major depressive disorder, single episode, unspecified: Secondary | ICD-10-CM | POA: Insufficient documentation

## 2012-08-07 DIAGNOSIS — Z79899 Other long term (current) drug therapy: Secondary | ICD-10-CM | POA: Insufficient documentation

## 2012-08-07 DIAGNOSIS — J069 Acute upper respiratory infection, unspecified: Secondary | ICD-10-CM | POA: Insufficient documentation

## 2012-08-07 DIAGNOSIS — J029 Acute pharyngitis, unspecified: Secondary | ICD-10-CM | POA: Insufficient documentation

## 2012-08-07 LAB — RAPID STREP SCREEN (MED CTR MEBANE ONLY): Streptococcus, Group A Screen (Direct): NEGATIVE

## 2012-08-07 MED ORDER — HYDROCOD POLST-CHLORPHEN POLST 10-8 MG/5ML PO LQCR: 5.0000 mL | Freq: Two times a day (BID) | ORAL | Status: DC | PRN

## 2012-08-07 MED ORDER — ACETAMINOPHEN 500 MG PO TABS
1000.0000 mg | ORAL_TABLET | Freq: Once | ORAL | Status: AC
  Administered 2012-08-07: 1000 mg via ORAL
  Filled 2012-08-07: qty 2

## 2012-08-07 NOTE — ED Provider Notes (Signed)
History     CSN: [id number]  Arrival date & time 08/07/12  1105   First MD Initiated Contact with Patient 08/07/12 1208      Chief Complaint  Patient presents with  . Generalized Body Aches  . Sore Throat    (Consider location/radiation/quality/duration/timing/severity/associated sxs/prior treatment) HPI 32 year old female presents to the emergency department with chief complaint of generalized bodyaches, neck stiffness, headache, and gingival bleeding.  Patient states that 2 days ago she was burping brushing her teeth when she developed pain in her gums and severe bleeding.  She states that she generally does not have bleeding from the gums with tooth brushing, however she doesn't bleed profusely with flossing.  The patient is an occasional smoker and has not seen a dentist in the past 4 years.  She denies any dental problems. Patient also has generalized body aches, neck stiffness, headache, chills, chest pain with cough.  Cough is nonproductive.  Onset of symptoms was yesterday.  Her roommate has had similar symptoms.  The patient denies any photophobia or phonophobia.  She did not have her flu shot this year.  Patient denies sore throat but says "I feel like it's swollen ."  She has not taken any medications such as Tylenol for her symptoms. Past Medical History  Diagnosis Date  . Depression     Past Surgical History  Procedure Date  . Hand surgery   . Hand surgery   . Tonsillectomy     History reviewed. No pertinent family history.  History  Substance Use Topics  . Smoking status: Never Smoker   . Smokeless tobacco: Not on file  . Alcohol Use: Yes     Comment: occ    OB History    Grav Para Term Preterm Abortions TAB SAB Ect Mult Living                  Review of Systems Ten systems reviewed and are negative for acute change, except as noted in the HPI.   Allergies  Penicillins  Home Medications   Current Outpatient Rx  Name  Route  Sig  Dispense  Refill    . AMPHETAMINE-DEXTROAMPHETAMINE 30 MG PO TABS   Oral   Take 30 mg by mouth daily.         Marland Kitchen CLONAZEPAM 0.5 MG PO TABS   Oral   Take 10 mg by mouth 2 (two) times daily as needed.         Marland Kitchen ESCITALOPRAM OXALATE 20 MG PO TABS   Oral   Take 20 mg by mouth daily.         Marland Kitchen LAMOTRIGINE 25 MG PO TABS   Oral   Take 25 mg by mouth daily.         . IBUPROFEN 200 MG PO TABS   Oral   Take 400 mg by mouth every 6 (six) hours as needed. For pain         . OVER THE COUNTER MEDICATION   Oral   Take 2 tablets by mouth once as needed. Hylan's Calmforte for sleep         . SERTRALINE HCL 100 MG PO TABS   Oral   Take 100 mg by mouth daily.           Marland Kitchen VISINE OP   Both Eyes   Place 1 drop into both eyes daily as needed. Itchy eyes           BP 133/90  Pulse 114  Temp 98.8 F (37.1 C) (Oral)  Resp 16  Ht 5\' 8"  (1.727 m)  Wt 126 lb 3.2 oz (57.244 kg)  BMI 19.19 kg/m2  SpO2 99%  LMP 08/07/2012  Physical Exam Appears moderately ill but not toxic; temperature as noted in vitals. Ears normal. Eyes:glassy appearance, no discharge, widely dilated pupils.  PERRLA, EOMI.  No nystagmus Neck: Tender left-sided cervical lymphadenopathy.  Full range of motion.  Neck is supple.  Negative Brudzinski's sign. Mouth: There is sign of developing dental care he on the posterior side of the bottom incisors.  There is no sign of infection or bleeding at this time.  She is tender to palpation of the posterior gum line. Heart: RRR, NO M/G/R Throat and pharynx normal.   Sinuses non tender.  The chest is clear. Abdomen is soft and nontender Neuro: No hyperreflexia or myoclonia his.  He is alert and oriented x4. Skin: Skin is red.  She is hot to the touch.  Skin is flushed.  I suspect patient is febrile and temperature is an accurate.  ED Course  Procedures (including critical care time)  Labs Reviewed - No data to display No results found.   No diagnosis found.    MDM  1:01  PM BP 133/90  Pulse 114  Temp 98.8 F (37.1 C) (Oral)  Resp 16  Ht 5\' 8"  (1.727 m)  Wt 126 lb 3.2 oz (57.244 kg)  BMI 19.19 kg/m2  SpO2 99%  LMP 08/07/2012 Patient with mild tachycardia, however she does take Adderall and took her pill this morning.  Patient is also afebrile at this moment and other vital signs are stable.  I do not suspect developing meningitis as patient is completely benign neurologic exam.  Going to give the patient high dose of Tylenol.  We will observe the patient and recheck vitals.      2:42 PM Patient seen in shared visit with Dr. Judd Lien. MDM Number of Diagnoses or Management Options URI (upper respiratory infection):   Patient with symptoms consistent with influenza.  No signs of dehydration, tolerating PO's.  Lungs are clear. Due to patient's presentation and physical exam a chest x-ray was not ordered bc likely diagnosis of flu.  Discussed the cost versus benefit of Tamiflu treatment with the patient.  The patient understands that symptoms are greater than the recommended 24-48 hour window of treatment.  Patient will be discharged with instructions to orally hydrate, rest, and use over-the-counter medications such as anti-inflammatories ibuprofen and Aleve for muscle aches and Tylenol for fever.  Patient will also be given a cough suppressant.   At this time there does not appear to be any evidence of an acute emergency medical condition and the patient appears stable for discharge with appropriate outpatient follow up.Diagnosis was discussed with patient who verbalizes understanding and is agreeable to discharge. Pt case discussed with Dr. Judd Lien who agrees with my plan.    Arthor Captain, PA-C 08/07/12 1445

## 2012-08-07 NOTE — ED Notes (Signed)
2 days sore throat cough congestion generalized body aches and chills started today

## 2012-08-08 NOTE — ED Provider Notes (Signed)
Medical screening examination/treatment/procedure(s) were conducted as a shared visit with non-physician practitioner(Abigail Harris) and myself.  I personally evaluated the patient during the encounter.  The patient presents with sore throat, cough, congestion for several days.    On exam, the vitals are stable and the patient is afebrile.  The heart and lung exam is unremarkable.  The abdomen is benign.  The oropharynx is clear as are the tm's.    The strep test returned negative.  I suspect this is viral in nature.  She will be discharged with nsaids, rest, return prn.  Geoffery Lyons, MD 08/08/12 1450

## 2012-11-20 DIAGNOSIS — F329 Major depressive disorder, single episode, unspecified: Secondary | ICD-10-CM | POA: Insufficient documentation

## 2012-11-20 DIAGNOSIS — Z79899 Other long term (current) drug therapy: Secondary | ICD-10-CM | POA: Insufficient documentation

## 2012-11-20 DIAGNOSIS — S838X9A Sprain of other specified parts of unspecified knee, initial encounter: Secondary | ICD-10-CM | POA: Insufficient documentation

## 2012-11-20 DIAGNOSIS — Y9389 Activity, other specified: Secondary | ICD-10-CM | POA: Insufficient documentation

## 2012-11-20 DIAGNOSIS — Y929 Unspecified place or not applicable: Secondary | ICD-10-CM | POA: Insufficient documentation

## 2012-11-20 DIAGNOSIS — X500XXA Overexertion from strenuous movement or load, initial encounter: Secondary | ICD-10-CM | POA: Insufficient documentation

## 2012-11-20 DIAGNOSIS — F3289 Other specified depressive episodes: Secondary | ICD-10-CM | POA: Insufficient documentation

## 2012-11-20 DIAGNOSIS — Z880 Allergy status to penicillin: Secondary | ICD-10-CM | POA: Insufficient documentation

## 2012-11-21 ENCOUNTER — Emergency Department (HOSPITAL_BASED_OUTPATIENT_CLINIC_OR_DEPARTMENT_OTHER)
Admission: EM | Admit: 2012-11-21 | Discharge: 2012-11-21 | Disposition: A | Discharge status: Home / Self Care | Payer: Self-pay | Attending: Emergency Medicine | Admitting: Emergency Medicine

## 2012-11-21 ENCOUNTER — Emergency Department (HOSPITAL_BASED_OUTPATIENT_CLINIC_OR_DEPARTMENT_OTHER)
Admit: 2012-11-21 | Discharge: 2012-11-21 | Disposition: A | Discharge status: Home / Self Care | Payer: Self-pay | Attending: Emergency Medicine | Admitting: Emergency Medicine

## 2012-11-21 ENCOUNTER — Encounter (HOSPITAL_BASED_OUTPATIENT_CLINIC_OR_DEPARTMENT_OTHER): Payer: Self-pay | Admitting: *Deleted

## 2012-11-21 DIAGNOSIS — M25569 Pain in unspecified knee: Secondary | ICD-10-CM | POA: Insufficient documentation

## 2012-11-21 DIAGNOSIS — S76112A Strain of left quadriceps muscle, fascia and tendon, initial encounter: Secondary | ICD-10-CM

## 2012-11-21 DIAGNOSIS — M7989 Other specified soft tissue disorders: Secondary | ICD-10-CM | POA: Insufficient documentation

## 2012-11-21 MED ORDER — NAPROXEN 250 MG PO TABS
500.0000 mg | ORAL_TABLET | Freq: Once | ORAL | Status: AC
  Administered 2012-11-21: 500 mg via ORAL
  Filled 2012-11-21: qty 2

## 2012-11-21 MED ORDER — HYDROCODONE-ACETAMINOPHEN 5-325 MG PO TABS: 1.0000 | ORAL_TABLET | Freq: Four times a day (QID) | ORAL | Status: DC | PRN

## 2012-11-21 MED ORDER — NAPROXEN SODIUM 220 MG PO TABS: ORAL_TABLET | ORAL | Status: DC

## 2012-11-21 NOTE — ED Provider Notes (Signed)
History     CSN: [id number]  Arrival date & time 11/20/12  2357   None     Chief Complaint  Patient presents with  . Knee Injury    (Consider location/radiation/quality/duration/timing/severity/associated sxs/prior treatment) HPI This is a 32 year old female who is quiet down to pick something up yesterday evening about 7:30. When she stood up she felt a pop in her left knee. There was no immediate pain but over the next approximately 2 hours she developed pain just above the patella. The pain is worse with movement or ambulation.  There is no gross laxity and there is no distal numbness or weakness associated with it. The pain is moderate to severe at its worst.   Past Medical History  Diagnosis Date  . Depression     Past Surgical History  Procedure Laterality Date  . Hand surgery    . Hand surgery    . Tonsillectomy      No family history on file.  History  Substance Use Topics  . Smoking status: Never Smoker   . Smokeless tobacco: Never Used  . Alcohol Use: 0.6 oz/week    1 Glasses of wine per week    OB History   Grav Para Term Preterm Abortions TAB SAB Ect Mult Living                  Review of Systems  All other systems reviewed and are negative.    Allergies  Penicillins  Home Medications   Current Outpatient Rx  Name  Route  Sig  Dispense  Refill  . amphetamine-dextroamphetamine (ADDERALL) 30 MG tablet   Oral   Take 30 mg by mouth daily.         . clonazePAM (KLONOPIN) 0.5 MG tablet   Oral   Take 10 mg by mouth 2 (two) times daily as needed.         Marland Kitchen escitalopram (LEXAPRO) 20 MG tablet   Oral   Take 20 mg by mouth daily.         Marland Kitchen ibuprofen (ADVIL,MOTRIN) 200 MG tablet   Oral   Take 400 mg by mouth every 6 (six) hours as needed. For pain         . lamoTRIgine (LAMICTAL) 25 MG tablet   Oral   Take 25 mg by mouth daily.         . Tetrahydrozoline HCl (VISINE OP)   Both Eyes   Place 1 drop into both eyes daily as needed.  Itchy eyes         . chlorpheniramine-HYDROcodone (TUSSIONEX PENNKINETIC ER) 10-8 MG/5ML LQCR   Oral   Take 5 mLs by mouth every 12 (twelve) hours as needed.   115 mL   0   . OVER THE COUNTER MEDICATION   Oral   Take 2 tablets by mouth once as needed. Hylan's Calmforte for sleep         . sertraline (ZOLOFT) 100 MG tablet   Oral   Take 100 mg by mouth daily.             BP 108/79  Pulse 97  Temp(Src) 98.2 F (36.8 C) (Oral)  Resp 18  Ht 5\' 8"  (1.727 m)  Wt 120 lb (54.432 kg)  BMI 18.25 kg/m2  SpO2 100%  LMP 11/14/2012  Physical Exam General: Well-developed, well-nourished female in no acute distress; appearance consistent with age of record HENT: normocephalic, atraumatic Eyes: pupils equal round and reactive to light; extraocular muscles  intact Neck: supple Heart: regular rate and rhythm Lungs: clear to auscultation bilaterally Abdomen: soft; nondistended; mild suprapubic tenderness; no masses or hepatosplenomegaly; bowel sounds present Extremities: No deformity; tenderness of left distal quadriceps tendon without palpable defect or significant edema; no instability of left knee joint; left lower extremity distally neurovascularly intact Neurologic: Awake, alert and oriented; motor function intact in all extremities and symmetric; no facial droop Skin: Warm and dry Psychiatric: Normal mood and affect    ED Course  Procedures (including critical care time)     MDM  Nursing notes and vitals signs, including pulse oximetry, reviewed.  Summary of this visit's results, reviewed by myself:  Labs:  No results found for this or any previous visit (from the past 24 hour(s)).  Imaging Studies: Dg Knee Complete 4 Views Left  11/21/2012  *RADIOLOGY REPORT*  Clinical Data: Pain and swelling, knee popped when bent over a pick up something  LEFT KNEE - COMPLETE 4+ VIEW  Comparison: None  Findings: Osseous mineralization grossly normal for technique. Minimal medial  compartment joint space narrowing. No acute fracture, dislocation, or bone destruction. Question small knee joint effusion.  IMPRESSION: Question small knee joint effusion. No acute bony abnormality. Minimal degenerative changes at medial compartment left knee.   Original Report Authenticated By: Ulyses Southward, M.D.             Hanley Seamen, MD 11/21/12 [phone]

## 2012-11-21 NOTE — ED Notes (Signed)
Pt reports she squatted down to pick something up and left knee "popped"  when pt stood up- now painful with swelling

## 2012-11-21 NOTE — ED Notes (Signed)
MD at bedside. 

## 2012-12-21 ENCOUNTER — Emergency Department (HOSPITAL_BASED_OUTPATIENT_CLINIC_OR_DEPARTMENT_OTHER)
Admission: EM | Admit: 2012-12-21 | Discharge: 2012-12-21 | Disposition: A | Discharge status: Home / Self Care | Payer: BC Managed Care – PPO | Attending: Emergency Medicine | Admitting: Emergency Medicine

## 2012-12-21 ENCOUNTER — Encounter (HOSPITAL_BASED_OUTPATIENT_CLINIC_OR_DEPARTMENT_OTHER): Payer: Self-pay | Admitting: *Deleted

## 2012-12-21 DIAGNOSIS — R52 Pain, unspecified: Secondary | ICD-10-CM | POA: Insufficient documentation

## 2012-12-21 DIAGNOSIS — R000 Tachycardia, unspecified: Secondary | ICD-10-CM | POA: Insufficient documentation

## 2012-12-21 DIAGNOSIS — R110 Nausea: Secondary | ICD-10-CM | POA: Insufficient documentation

## 2012-12-21 DIAGNOSIS — Z79899 Other long term (current) drug therapy: Secondary | ICD-10-CM | POA: Insufficient documentation

## 2012-12-21 DIAGNOSIS — N390 Urinary tract infection, site not specified: Secondary | ICD-10-CM

## 2012-12-21 DIAGNOSIS — M545 Low back pain, unspecified: Secondary | ICD-10-CM | POA: Insufficient documentation

## 2012-12-21 DIAGNOSIS — R6883 Chills (without fever): Secondary | ICD-10-CM | POA: Insufficient documentation

## 2012-12-21 DIAGNOSIS — Z8614 Personal history of Methicillin resistant Staphylococcus aureus infection: Secondary | ICD-10-CM | POA: Insufficient documentation

## 2012-12-21 DIAGNOSIS — R5381 Other malaise: Secondary | ICD-10-CM | POA: Insufficient documentation

## 2012-12-21 DIAGNOSIS — Z880 Allergy status to penicillin: Secondary | ICD-10-CM | POA: Insufficient documentation

## 2012-12-21 DIAGNOSIS — F3289 Other specified depressive episodes: Secondary | ICD-10-CM | POA: Insufficient documentation

## 2012-12-21 DIAGNOSIS — F329 Major depressive disorder, single episode, unspecified: Secondary | ICD-10-CM | POA: Insufficient documentation

## 2012-12-21 DIAGNOSIS — Z331 Pregnant state, incidental: Secondary | ICD-10-CM | POA: Insufficient documentation

## 2012-12-21 DIAGNOSIS — F17200 Nicotine dependence, unspecified, uncomplicated: Secondary | ICD-10-CM | POA: Insufficient documentation

## 2012-12-21 LAB — URINALYSIS, ROUTINE W REFLEX MICROSCOPIC
Bilirubin Urine: NEGATIVE
Nitrite: POSITIVE — AB
Protein, ur: 100 mg/dL — AB
Specific Gravity, Urine: 1.024 (ref 1.005–1.030)
Urobilinogen, UA: 1 mg/dL (ref 0.0–1.0)

## 2012-12-21 LAB — URINE MICROSCOPIC-ADD ON

## 2012-12-21 LAB — PREGNANCY, URINE: Preg Test, Ur: POSITIVE — AB

## 2012-12-21 MED ORDER — PHENAZOPYRIDINE HCL 200 MG PO TABS: 200.0000 mg | ORAL_TABLET | Freq: Three times a day (TID) | ORAL | Status: DC

## 2012-12-21 MED ORDER — NITROFURANTOIN MONOHYD MACRO 100 MG PO CAPS: 100.0000 mg | ORAL_CAPSULE | Freq: Two times a day (BID) | ORAL | Status: DC

## 2012-12-21 MED ORDER — PHENAZOPYRIDINE HCL 100 MG PO TABS
200.0000 mg | ORAL_TABLET | Freq: Once | ORAL | Status: AC
  Administered 2012-12-21: 200 mg via ORAL
  Filled 2012-12-21: qty 2

## 2012-12-21 MED ORDER — NITROFURANTOIN MONOHYD MACRO 100 MG PO CAPS
100.0000 mg | ORAL_CAPSULE | Freq: Once | ORAL | Status: AC
  Administered 2012-12-21: 100 mg via ORAL
  Filled 2012-12-21: qty 1

## 2012-12-21 NOTE — ED Provider Notes (Signed)
History     CSN: [id number]  Arrival date & time 12/21/12  1191   None     Chief Complaint  Patient presents with  . Chills    (Consider location/radiation/quality/duration/timing/severity/associated sxs/prior treatment) HPI This is a 32 year old female with a two-day history of chills, body aches and general malaise. She is not aware she's had a fever. She has had some suprapubic and epigastric pain as well as low back pain. She has had nausea but no vomiting or diarrhea. She denies vaginal bleeding or discharge. She denies cough, shortness of breath or other cold symptoms. Symptoms are moderate to severe. There is no specific mitigating or exacerbating factors.  Past Medical History  Diagnosis Date  . Depression   . MRSA (methicillin resistant staph aureus) culture positive     Past Surgical History  Procedure Laterality Date  . Hand surgery    . Hand surgery    . Tonsillectomy      No family history on file.  History  Substance Use Topics  . Smoking status: Current Every Day Smoker  . Smokeless tobacco: Never Used  . Alcohol Use: No    OB History   Grav Para Term Preterm Abortions TAB SAB Ect Mult Living                  Review of Systems  All other systems reviewed and are negative.    Allergies  Penicillins  Home Medications   Current Outpatient Rx  Name  Route  Sig  Dispense  Refill  . amphetamine-dextroamphetamine (ADDERALL) 30 MG tablet   Oral   Take 30 mg by mouth daily.         . chlorpheniramine-HYDROcodone (TUSSIONEX PENNKINETIC ER) 10-8 MG/5ML LQCR   Oral   Take 5 mLs by mouth every 12 (twelve) hours as needed.   115 mL   0   . clonazePAM (KLONOPIN) 0.5 MG tablet   Oral   Take 10 mg by mouth 2 (two) times daily as needed.         Marland Kitchen escitalopram (LEXAPRO) 20 MG tablet   Oral   Take 20 mg by mouth daily.         Marland Kitchen HYDROcodone-acetaminophen (NORCO/VICODIN) 5-325 MG per tablet   Oral   Take 1-2 tablets by mouth every 6 (six)  hours as needed for pain.   20 tablet   0   . ibuprofen (ADVIL,MOTRIN) 200 MG tablet   Oral   Take 400 mg by mouth every 6 (six) hours as needed. For pain         . lamoTRIgine (LAMICTAL) 25 MG tablet   Oral   Take 25 mg by mouth daily.         . naproxen sodium (ALEVE) 220 MG tablet      Take 2 tablets twice a day for knee pain.         Marland Kitchen OVER THE COUNTER MEDICATION   Oral   Take 2 tablets by mouth once as needed. Hylan's Calmforte for sleep         . sertraline (ZOLOFT) 100 MG tablet   Oral   Take 100 mg by mouth daily.           . Tetrahydrozoline HCl (VISINE OP)   Both Eyes   Place 1 drop into both eyes daily as needed. Itchy eyes           BP 114/68  Pulse 102  Temp(Src) 98.1 F (36.7 C) (  Oral)  Resp 22  SpO2 98%  LMP 12/14/2012  Physical Exam General: Well-developed, thin female in no acute distress; appearance consistent with age of record HENT: normocephalic, atraumatic; shivering Eyes: pupils equal round and reactive to light; extraocular muscles intact Neck: supple Heart: regular rate and rhythm; tachycardia Lungs: clear to auscultation bilaterally Abdomen: soft; nondistended; no suprapubic tenderness; epigastric tenderness; no masses or hepatosplenomegaly; bowel sounds present GU: No CVA tenderness Extremities: No deformity; full range of motion; pulses normal; no edema Neurologic: Awake, alert and oriented; motor function intact in all extremities and symmetric; no facial droop Skin: Warm and dry Psychiatric: Flat affect    ED Course  Procedures (including critical care time)     MDM   Nursing notes and vitals signs, including pulse oximetry, reviewed.  Summary of this visit's results, reviewed by myself:  Labs:  Results for orders placed during the hospital encounter of 12/21/12 (from the past 24 hour(s))  URINALYSIS, ROUTINE W REFLEX MICROSCOPIC     Status: Abnormal   Collection Time    12/21/12  6:20 AM      Result  Value Range   Color, Urine YELLOW  YELLOW   APPearance CLOUDY (*) CLEAR   Specific Gravity, Urine 1.024  1.005 - 1.030   pH 6.0  5.0 - 8.0   Glucose, UA >1000 (*) NEGATIVE mg/dL   Hgb urine dipstick MODERATE (*) NEGATIVE   Bilirubin Urine NEGATIVE  NEGATIVE   Ketones, ur NEGATIVE  NEGATIVE mg/dL   Protein, ur 409 (*) NEGATIVE mg/dL   Urobilinogen, UA 1.0  0.0 - 1.0 mg/dL   Nitrite POSITIVE (*) NEGATIVE   Leukocytes, UA MODERATE (*) NEGATIVE  PREGNANCY, URINE     Status: Abnormal   Collection Time    12/21/12  6:20 AM      Result Value Range   Preg Test, Ur POSITIVE (*) NEGATIVE  URINE MICROSCOPIC-ADD ON     Status: Abnormal   Collection Time    12/21/12  6:20 AM      Result Value Range   Squamous Epithelial / LPF FEW (*) RARE   WBC, UA TOO NUMEROUS TO COUNT  <3 WBC/hpf   RBC / HPF 3-6  <3 RBC/hpf   Bacteria, UA MANY (*) RARE            Hanley Seamen, MD 12/21/12 [phone]

## 2012-12-21 NOTE — ED Notes (Addendum)
C/o chills, aching all over, fever unknown. Also c/o urine odor as well. Denies any urinary burning or frequency. Denies v/d.  Symptoms started 2 days ago. Denies cough/cold symptoms. C/o lower abd pain and lower back pain. States lower back pain is constant. Denies any injury. States she has irregular menstrual periods. Last small amount of bleeding last week.

## 2012-12-22 ENCOUNTER — Telehealth (HOSPITAL_COMMUNITY): Payer: Self-pay | Admitting: Emergency Medicine

## 2012-12-22 LAB — URINE CULTURE: Colony Count: >=100000

## 2012-12-22 NOTE — ED Notes (Signed)
Call for Urine Cx results, still In process.

## 2012-12-23 NOTE — ED Notes (Signed)
Post ED Visit - Positive Culture Follow-up  Culture report reviewed by antimicrobial stewardship pharmacist: []  Wes Dulaney, Pharm.D., BCPS []  Celedonio Miyamoto, Pharm.D., BCPS [x]  Georgina Pillion, Pharm.D., BCPS []  [place], [address].D., BCPS, AAHIVP []  Estella Husk, Pharm.D., BCPS, AAHIV  Positive urine culture  no further patient follow-up is required at this time.  Larena Sox 12/23/2012, 3:21 PM

## 2013-05-18 ENCOUNTER — Emergency Department (HOSPITAL_COMMUNITY)
Admission: EM | Admit: 2013-05-18 | Discharge: 2013-05-18 | Disposition: A | Discharge status: Other Acute Inpatient Hospital | Payer: BC Managed Care – PPO | Attending: Emergency Medicine | Admitting: Emergency Medicine

## 2013-05-18 ENCOUNTER — Encounter (HOSPITAL_COMMUNITY): Payer: Self-pay | Admitting: Emergency Medicine

## 2013-05-18 ENCOUNTER — Inpatient Hospital Stay (HOSPITAL_COMMUNITY)
Admission: AD | Admit: 2013-05-18 | Discharge: 2013-05-24 | DRG: 430 | Disposition: A | Discharge status: Home / Self Care | Payer: BC Managed Care – PPO | Source: Intra-hospital | Attending: Psychiatry | Admitting: Psychiatry

## 2013-05-18 ENCOUNTER — Encounter (HOSPITAL_COMMUNITY): Payer: Self-pay

## 2013-05-18 DIAGNOSIS — F329 Major depressive disorder, single episode, unspecified: Secondary | ICD-10-CM

## 2013-05-18 DIAGNOSIS — F332 Major depressive disorder, recurrent severe without psychotic features: Principal | ICD-10-CM | POA: Diagnosis present

## 2013-05-18 DIAGNOSIS — R109 Unspecified abdominal pain: Secondary | ICD-10-CM | POA: Insufficient documentation

## 2013-05-18 DIAGNOSIS — Z880 Allergy status to penicillin: Secondary | ICD-10-CM | POA: Insufficient documentation

## 2013-05-18 DIAGNOSIS — F1310 Sedative, hypnotic or anxiolytic abuse, uncomplicated: Secondary | ICD-10-CM | POA: Insufficient documentation

## 2013-05-18 DIAGNOSIS — Z609 Problem related to social environment, unspecified: Secondary | ICD-10-CM

## 2013-05-18 DIAGNOSIS — F32A Depression, unspecified: Secondary | ICD-10-CM | POA: Diagnosis present

## 2013-05-18 DIAGNOSIS — Z79899 Other long term (current) drug therapy: Secondary | ICD-10-CM | POA: Insufficient documentation

## 2013-05-18 DIAGNOSIS — N939 Abnormal uterine and vaginal bleeding, unspecified: Secondary | ICD-10-CM | POA: Insufficient documentation

## 2013-05-18 DIAGNOSIS — F3289 Other specified depressive episodes: Secondary | ICD-10-CM | POA: Insufficient documentation

## 2013-05-18 DIAGNOSIS — F17200 Nicotine dependence, unspecified, uncomplicated: Secondary | ICD-10-CM | POA: Insufficient documentation

## 2013-05-18 DIAGNOSIS — F1210 Cannabis abuse, uncomplicated: Secondary | ICD-10-CM | POA: Diagnosis present

## 2013-05-18 DIAGNOSIS — F411 Generalized anxiety disorder: Secondary | ICD-10-CM | POA: Insufficient documentation

## 2013-05-18 DIAGNOSIS — R45851 Suicidal ideations: Secondary | ICD-10-CM | POA: Insufficient documentation

## 2013-05-18 DIAGNOSIS — R443 Hallucinations, unspecified: Secondary | ICD-10-CM | POA: Insufficient documentation

## 2013-05-18 DIAGNOSIS — Z8614 Personal history of Methicillin resistant Staphylococcus aureus infection: Secondary | ICD-10-CM | POA: Insufficient documentation

## 2013-05-18 DIAGNOSIS — F1510 Other stimulant abuse, uncomplicated: Secondary | ICD-10-CM | POA: Insufficient documentation

## 2013-05-18 DIAGNOSIS — Z3202 Encounter for pregnancy test, result negative: Secondary | ICD-10-CM | POA: Insufficient documentation

## 2013-05-18 DIAGNOSIS — N926 Irregular menstruation, unspecified: Secondary | ICD-10-CM | POA: Insufficient documentation

## 2013-05-18 DIAGNOSIS — F1020 Alcohol dependence, uncomplicated: Secondary | ICD-10-CM | POA: Diagnosis present

## 2013-05-18 LAB — RAPID URINE DRUG SCREEN, HOSP PERFORMED
Barbiturates: NOT DETECTED
Tetrahydrocannabinol: POSITIVE — AB

## 2013-05-18 LAB — COMPREHENSIVE METABOLIC PANEL
ALT: 12 U/L (ref 0–35)
AST: 13 U/L (ref 0–37)
Alkaline Phosphatase: 48 U/L (ref 39–117)
CO2: 25 mEq/L (ref 19–32)
Chloride: 105 mEq/L (ref 96–112)
GFR calc Af Amer: >90 mL/min (ref >90)
GFR calc non Af Amer: >90 mL/min (ref >90)
Glucose, Bld: 119 mg/dL — ABNORMAL HIGH (ref 70–99)
Potassium: 3.7 mEq/L (ref 3.5–5.1)
Sodium: 141 mEq/L (ref 135–145)

## 2013-05-18 LAB — CBC
Hemoglobin: 13.9 g/dL (ref 12.0–15.0)
RBC: 4.55 MIL/uL (ref 3.87–5.11)
WBC: 6.7 10*3/uL (ref 4.0–10.5)

## 2013-05-18 LAB — POCT PREGNANCY, URINE: Preg Test, Ur: NEGATIVE

## 2013-05-18 MED ORDER — ALUM & MAG HYDROXIDE-SIMETH 200-200-20 MG/5ML PO SUSP: 30.0000 mL | ORAL | Status: DC | PRN

## 2013-05-18 MED ORDER — ZOLPIDEM TARTRATE 5 MG PO TABS: 5.0000 mg | ORAL_TABLET | Freq: Every evening | ORAL | Status: DC | PRN

## 2013-05-18 MED ORDER — NICOTINE 21 MG/24HR TD PT24: 21.0000 mg | MEDICATED_PATCH | Freq: Every day | TRANSDERMAL | Status: DC

## 2013-05-18 MED ORDER — MAGNESIUM HYDROXIDE 400 MG/5ML PO SUSP: 30.0000 mL | Freq: Every day | ORAL | Status: DC | PRN

## 2013-05-18 MED ORDER — TRAZODONE HCL 50 MG PO TABS: 50.0000 mg | ORAL_TABLET | Freq: Every evening | ORAL | Status: DC | PRN

## 2013-05-18 MED ORDER — BUPROPION HCL ER (SR) 100 MG PO TB12
100.0000 mg | ORAL_TABLET | Freq: Two times a day (BID) | ORAL | Status: DC
  Filled 2013-05-18: qty 1

## 2013-05-18 MED ORDER — ESCITALOPRAM OXALATE 10 MG PO TABS
20.0000 mg | ORAL_TABLET | Freq: Every day | ORAL | Status: DC
  Filled 2013-05-18: qty 2

## 2013-05-18 MED ORDER — IBUPROFEN 200 MG PO TABS
600.0000 mg | ORAL_TABLET | Freq: Three times a day (TID) | ORAL | Status: DC | PRN
  Administered 2013-05-18: 600 mg via ORAL
  Filled 2013-05-18: qty 3

## 2013-05-18 MED ORDER — HYDROXYZINE HCL 25 MG PO TABS
25.0000 mg | ORAL_TABLET | Freq: Four times a day (QID) | ORAL | Status: DC | PRN
  Administered 2013-05-21 – 2013-05-23 (×4): 25 mg via ORAL
  Filled 2013-05-18 (×3): qty 1
  Filled 2013-05-18: qty 10
  Filled 2013-05-18: qty 1

## 2013-05-18 MED ORDER — ONDANSETRON HCL 4 MG PO TABS: 4.0000 mg | ORAL_TABLET | Freq: Three times a day (TID) | ORAL | Status: DC | PRN

## 2013-05-18 MED ORDER — LORAZEPAM 1 MG PO TABS
1.0000 mg | ORAL_TABLET | Freq: Three times a day (TID) | ORAL | Status: DC | PRN
  Administered 2013-05-18: 1 mg via ORAL
  Filled 2013-05-18: qty 1

## 2013-05-18 MED ORDER — ACETAMINOPHEN 325 MG PO TABS
650.0000 mg | ORAL_TABLET | Freq: Four times a day (QID) | ORAL | Status: DC | PRN
  Administered 2013-05-19: 650 mg via ORAL
  Filled 2013-05-18: qty 2

## 2013-05-18 NOTE — ED Notes (Signed)
Oriented pt. To psych ED. 

## 2013-05-18 NOTE — Progress Notes (Signed)
Patient ID: Paula Sosa, female   DOB: 1981-03-13, 32 y.o.   MRN: [id number]  Admission Note:  32 yr female who presents [facility] in no acute distress for the treatment of SI and Depression. Pt appears flat and depressed, also tearful upon admission. Pt was calm and cooperative with admission process. Pt presents with passive SI and contracts for safety upon admission. Pt denies AVH . Pt states " feel useless hopeless and don't know what to do". Pt states she has been feeling this way off and on for 2 yrs and just got fed up and wanted to do something about it. Pt states she has irregular sleep patterns, she either sleeps too much or not enough.    POC and unit policies explained and understanding verbalized. Consents obtained. Food and fluids offered, and fluids accepted.  R: Pt had no additional questions or concerns.

## 2013-05-18 NOTE — Consult Note (Signed)
[facility] Face-to-Face Psychiatry Consult   Reason for Consult:  Evaluation for inpatient treatment Referring Physician:  EDP  JILENE SPOHR is an 32 y.o. female.  Assessment: AXIS I:  Depressive Disorder NOS AXIS II:  Deferred AXIS III:   Past Medical History  Diagnosis Date  . Depression   . MRSA (methicillin resistant staph aureus) culture positive    AXIS IV:  other psychosocial or environmental problems AXIS V:  11-20 some danger of hurting self or others possible OR occasionally fails to maintain minimal personal hygiene OR gross impairment in communication  Plan:  Recommend psychiatric Inpatient admission when medically cleared.  Subjective:   Paula Sosa is a 32 y.o. female.  HPI:  Patient presents to [facility] with complaints of worsening depression.  Patient in bed crying. Patient states "I can't function; I am sad all the time and crying; I feel hopeless and tired.  I have trying but I'm tired of being this away.  I've been like this my whole life; I'm just tired and I've had enough."  When asked about suicidal ideation patient states "I just want to die but I don't want to kill myself.  I have a dog and my mom and that has kept me wanting to keep trying so far; but I can't take it anymore."  Patient denies homicidal ideation, psychosis, and paranoia.  Patient states that she was at [facility] years ago and has outpatient services at Dr. Ann Maki McKinney's office but doesn't remeber when last appointment was.  Patient states that at last visit Lexapro was discontinued and she was started on Wellbutrin.    HPI Elements:   Location:  [facility] ED. Quality:  Affecting patient mentally and physicially. Severity:  Patient not wanting to live.  Past Psychiatric History: Past Medical History  Diagnosis Date  . Depression   . MRSA (methicillin resistant staph aureus) culture positive     reports that she has been smoking.  She has never used smokeless tobacco. She reports that  she does not drink alcohol or use illicit drugs. No family history on file.         Allergies:   Allergies  Allergen Reactions  . Penicillins Rash    ACT Assessment Complete:  No:   Past Psychiatric History: Diagnosis:  Depressive Disorder  Hospitalizations:  Prior history  Outpatient Care:  Dr. Emerson Monte office  Substance Abuse Care:  History of use  Self-Mutilation:    Suicidal Attempts:    Homicidal Behaviors:  Denies   Violent Behaviors:  Denies   Place of Residence:  [place] Marital Status:  Single Employed/Unemployed:  Employed Education:   Family Supports:  Mother Objective: Last menstrual period 05/18/2013.There is no weight on file to calculate BMI. Results for orders placed during the hospital encounter of 05/18/13 (from the past 72 hour(s))  CBC     Status: None   Collection Time    05/18/13  1:05 PM      Result Value Range   WBC 6.7  4.0 - 10.5 K/uL   RBC 4.55  3.87 - 5.11 MIL/uL   Hemoglobin 13.9  12.0 - 15.0 g/dL   HCT 16.1  09.6 - 04.5 %   MCV 89.7  78.0 - 100.0 fL   MCH 30.5  26.0 - 34.0 pg   MCHC 34.1  30.0 - 36.0 g/dL   RDW 40.9  81.1 - 91.4 %   Platelets 232  150 - 400 K/uL  COMPREHENSIVE METABOLIC PANEL  Status: Abnormal   Collection Time    05/18/13  1:05 PM      Result Value Range   Sodium 141  135 - 145 mEq/L   Potassium 3.7  3.5 - 5.1 mEq/L   Chloride 105  96 - 112 mEq/L   CO2 25  19 - 32 mEq/L   Glucose, Bld 119 (*) 70 - 99 mg/dL   BUN 16  6 - 23 mg/dL   Creatinine, Ser 4.09  0.50 - 1.10 mg/dL   Calcium 9.5  8.4 - 81.1 mg/dL   Total Protein 6.6  6.0 - 8.3 g/dL   Albumin 3.7  3.5 - 5.2 g/dL   AST 13  0 - 37 U/L   ALT 12  0 - 35 U/L   Alkaline Phosphatase 48  39 - 117 U/L   Total Bilirubin 0.2 (*) 0.3 - 1.2 mg/dL   GFR calc non Af Amer >90  >90 mL/min   GFR calc Af Amer >90  >90 mL/min   Comment: (NOTE)     The eGFR has been calculated using the CKD EPI equation.     This calculation has not been validated in all  clinical situations.     eGFR's persistently <90 mL/min signify possible Chronic Kidney     Disease.  ETHANOL     Status: None   Collection Time    05/18/13  1:05 PM      Result Value Range   Alcohol, Ethyl (B) <11  0 - 11 mg/dL   Comment:            LOWEST DETECTABLE LIMIT FOR     SERUM ALCOHOL IS 11 mg/dL     FOR MEDICAL PURPOSES ONLY  POCT PREGNANCY, URINE     Status: None   Collection Time    05/18/13  1:38 PM      Result Value Range   Preg Test, Ur NEGATIVE  NEGATIVE   Comment:            THE SENSITIVITY OF THIS     METHODOLOGY IS >24 mIU/mL  URINE RAPID DRUG SCREEN (HOSP PERFORMED)     Status: Abnormal   Collection Time    05/18/13  1:40 PM      Result Value Range   Opiates NONE DETECTED  NONE DETECTED   Cocaine NONE DETECTED  NONE DETECTED   Benzodiazepines POSITIVE (*) NONE DETECTED   Amphetamines POSITIVE (*) NONE DETECTED   Tetrahydrocannabinol POSITIVE (*) NONE DETECTED   Barbiturates NONE DETECTED  NONE DETECTED   Comment:            DRUG SCREEN FOR MEDICAL PURPOSES     ONLY.  IF CONFIRMATION IS NEEDED     FOR ANY PURPOSE, NOTIFY LAB     WITHIN 5 DAYS.                LOWEST DETECTABLE LIMITS     FOR URINE DRUG SCREEN     Drug Class       Cutoff (ng/mL)     Amphetamine      1000     Barbiturate      200     Benzodiazepine   200     Tricyclics       300     Opiates          300     Cocaine          300     THC  50    Current Facility-Administered Medications  Medication Dose Route Frequency Provider Last Rate Last Dose  . ibuprofen (ADVIL,MOTRIN) tablet 600 mg  600 mg Oral Q8H PRN Shari A Upstill, PA-C   600 mg at 05/18/13 1356  . LORazepam (ATIVAN) tablet 1 mg  1 mg Oral Q8H PRN Shari A Upstill, PA-C   1 mg at 05/18/13 1356  . nicotine (NICODERM CQ - dosed in mg/24 hours) patch 21 mg  21 mg Transdermal Daily Shari A Upstill, PA-C      . ondansetron (ZOFRAN) tablet 4 mg  4 mg Oral Q8H PRN Shari A Upstill, PA-C      . zolpidem (AMBIEN) tablet  5 mg  5 mg Oral QHS PRN Arnoldo Hooker, PA-C       Current Outpatient Prescriptions  Medication Sig Dispense Refill  . amphetamine-dextroamphetamine (ADDERALL) 30 MG tablet Take 30 mg by mouth daily.      Marland Kitchen buPROPion (WELLBUTRIN SR) 100 MG 12 hr tablet Take 100 mg by mouth 2 (two) times daily.      . clonazePAM (KLONOPIN) 0.5 MG tablet Take 10 mg by mouth 2 (two) times daily as needed for anxiety.       Marland Kitchen HYDROcodone-acetaminophen (NORCO/VICODIN) 5-325 MG per tablet Take 1-2 tablets by mouth every 6 (six) hours as needed for pain.  20 tablet  0  . ibuprofen (ADVIL,MOTRIN) 200 MG tablet Take 400 mg by mouth every 6 (six) hours as needed for pain. For pain      . Tetrahydrozoline HCl (VISINE OP) Place 1 drop into both eyes daily as needed (itchy eyes). Itchy eyes        Psychiatric Specialty Exam:     Last menstrual period 05/18/2013.There is no weight on file to calculate BMI.  General Appearance: Casual  Eye Contact::  Fair  Speech:  Clear and Coherent and Normal Rate  Volume:  Normal  Mood:  Anxious, Depressed, Hopeless and Worthless  Affect:  Blunt, Depressed, Flat and Tearful  Thought Process:  Circumstantial and Goal Directed  Orientation:  Full (Time, Place, and Person)  Thought Content:  Rumination  Suicidal Thoughts:  Yes.  without intent/plan  Homicidal Thoughts:  No  Memory:  Immediate;   Good Recent;   Good Remote;   Fair  Judgement:  Impaired  Insight:  Present  Psychomotor Activity:  Normal  Concentration:  Fair  Recall:  Good  Akathisia:  No  Handed:  Right  AIMS (if indicated):     Assets:  Communication Skills Desire for Improvement Housing Social Support  Sleep:      Face to face interview and consult with Dr. Ladona Ridgel  Treatment Plan Summary: Daily contact with patient to assess and evaluate symptoms and progress in treatment Medication management  Disposition:  Recommendation for inpatient treatment.  Start home medication; monitor for safety and  stabilization.  Assunta Found, FNP-BC 05/18/2013 5:03 PM

## 2013-05-18 NOTE — BH Assessment (Signed)
Writer was informed that the patient is in need of a consult.  Writer informed Idalia Needle) [facility] so that the patient can be placed on the shift report and Tele Assessment Log.  Writer informed Dr. Ladona Ridgel and NP, Denice Bors that the patient needs to be assessed.

## 2013-05-18 NOTE — Tx Team (Signed)
Initial Interdisciplinary Treatment Plan  PATIENT STRENGTHS: (choose at least two) General fund of knowledge  PATIENT STRESSORS: Medication change or noncompliance   PROBLEM LIST: Problem List/Patient Goals Date to be addressed Date deferred Reason deferred Estimated date of resolution  Depression 05/18/13     SI 05/18/13                                                DISCHARGE CRITERIA:  Improved stabilization in mood, thinking, and/or behavior Reduction of life-threatening or endangering symptoms to within safe limits  PRELIMINARY DISCHARGE PLAN: Attend aftercare/continuing care group Outpatient therapy  PATIENT/FAMIILY INVOLVEMENT: This treatment plan has been presented to and reviewed with the patient, Paula Sosa.  The patient and family have been given the opportunity to ask questions and make suggestions.  Jacques Navy A 05/18/2013, 9:38 PM

## 2013-05-18 NOTE — Progress Notes (Signed)
CSW completed support paperwork with pt, faxed copy to [facility], and gave originals to nurse for pts chart.  Marva Panda, LCSWA  [phone]

## 2013-05-18 NOTE — ED Notes (Signed)
Report called to Reinaldo Raddle RN [facility].

## 2013-05-18 NOTE — ED Notes (Signed)
Patient transported by Juel Burrow.

## 2013-05-18 NOTE — Treatment Plan (Signed)
Accepted to [facility] by Rankin, NP to [phone]

## 2013-05-18 NOTE — ED Notes (Signed)
Per pt, has bad PMS-states she has been dealing with anxiety and depression on and off for years-prior to period she can not sleep gets agitated told family member she wanted to die today

## 2013-05-18 NOTE — ED Provider Notes (Addendum)
CSN: [id number]     Arrival date & time 05/18/13  1218 History  This chart was scribed for non-physician practitioner working with Donnetta Hutching, MD by Paula Sosa, ED scribe. This patient was seen in room WTR3/WLPT3 and the patient's care was started at 12:34 PM.      Chief Complaint  Patient presents with  . Medical Clearance   (Consider location/radiation/quality/duration/timing/severity/associated sxs/prior Treatment) The history is provided by the patient, a parent and medical records. No language interpreter was used.  HPI HPI Comments: Paula Sosa is a 32 y.o. female who presents to the Emergency Department for medical clearance. Pt has a chronic hx of depression. She reports having more frequent SI for the past few days and nothing seems to make them diminish. She also reports "wasting away" and loosing an significant amount of weight recently. Pt states that she feels like she want to "take pills" but is prohibted by the the thought of leaving her dogs and her mother behind. Pt mentions having HI about the gentleman in the ED front office (billings) but denies having them before.She reports that she is unable to function and feels "useless". Pt is currently taking Wellbutrin but was taking Lexapro for a year with not improvement. Pt's mother reports that her recent depressive episode may be attributed to the pt's hatred of her current job and her living conditions. She is having abdomninal cramps from her mense and explains this is typical. Pt is taking hydrocodone for the pain. She states being paranoid at work and feels as though someone is plotting against her. Pt mentions smoking marijuanna four days ago(for 48 hours straight) and usually smokes marijuana everyday. She also smokes cigarettes everyday but more often on while her menstraul cycle and drinking occasionally. She states having symptoms of severe abdominal cramps and foul smelling urine output.  She is not taking birth  controls. Pt deneis dysuria, frequency, vagianal discharge and vagainal itching. She had a severe kidney (bilateral) and pancreatitis previously. Pt has a chronic hx of depression.  She has a family hx of anxiety (mother was treated in a mental facility).  Pt works for a [employer] for the past months.   Past Medical History  Diagnosis Date  . Depression   . MRSA (methicillin resistant staph aureus) culture positive    Past Surgical History  Procedure Laterality Date  . Hand surgery    . Hand surgery    . Tonsillectomy     No family history on file. History  Substance Use Topics  . Smoking status: Current Every Day Smoker  . Smokeless tobacco: Never Used  . Alcohol Use: No   OB History   Grav Para Term Preterm Abortions TAB SAB Ect Mult Living                 Review of Systems  Gastrointestinal: Positive for abdominal pain.  Genitourinary: Positive for menstrual problem. Negative for dysuria, frequency and vaginal discharge.  Psychiatric/Behavioral: Positive for suicidal ideas and hallucinations. The patient is nervous/anxious.     Allergies  Penicillins  Home Medications   Current Outpatient Rx  Name  Route  Sig  Dispense  Refill  . amphetamine-dextroamphetamine (ADDERALL) 30 MG tablet   Oral   Take 30 mg by mouth daily.         Marland Kitchen buPROPion (WELLBUTRIN SR) 100 MG 12 hr tablet   Oral   Take 100 mg by mouth 2 (two) times daily.         Marland Kitchen  clonazePAM (KLONOPIN) 0.5 MG tablet   Oral   Take 10 mg by mouth 2 (two) times daily as needed for anxiety.          Marland Kitchen HYDROcodone-acetaminophen (NORCO/VICODIN) 5-325 MG per tablet   Oral   Take 1-2 tablets by mouth every 6 (six) hours as needed for pain.   20 tablet   0   . ibuprofen (ADVIL,MOTRIN) 200 MG tablet   Oral   Take 400 mg by mouth every 6 (six) hours as needed for pain. For pain         . Tetrahydrozoline HCl (VISINE OP)   Both Eyes   Place 1 drop into both eyes daily as needed (itchy eyes).  Itchy eyes          LMP 05/18/2013 Physical Exam  Constitutional: She is oriented to person, place, and time. She appears well-developed and well-nourished.  HENT:  Head: Normocephalic.  Neck: Normal range of motion. Neck supple.  Cardiovascular: Normal rate and regular rhythm.   Pulmonary/Chest: Effort normal and breath sounds normal.  Abdominal: Soft. Bowel sounds are normal. There is no tenderness. There is no rebound and no guarding.  Musculoskeletal: Normal range of motion.  Neurological: She is alert and oriented to person, place, and time.  Skin: Skin is warm and dry. No rash noted.  Psychiatric: She has a normal mood and affect.    ED Course  Procedures (including critical care time)  DIAGNOSTIC STUDIES:    COORDINATION OF CARE: 12:40 Discussed course of care with pt which includes ED micro laboratory test. Pt understands and agrees.  Labs Review Labs Reviewed  COMPREHENSIVE METABOLIC PANEL - Abnormal; Notable for the following:    Glucose, Bld 119 (*)    Total Bilirubin 0.2 (*)    All other components within normal limits  URINE RAPID DRUG SCREEN (HOSP PERFORMED) - Abnormal; Notable for the following:    Benzodiazepines POSITIVE (*)    Amphetamines POSITIVE (*)    Tetrahydrocannabinol POSITIVE (*)    All other components within normal limits  CBC  ETHANOL  POCT PREGNANCY, URINE   Imaging Review No results found.  EKG Interpretation   None       MDM  No diagnosis found. 1. Depression  [facility]HS to evaluate to determine disposition.    I personally performed the services described in this documentation, which was scribed in my presence. The recorded information has been reviewed and is accurate.   Arnoldo Hooker, PA-C 05/18/13 1737  Arnoldo Hooker, PA-C 06/06/13 1601

## 2013-05-19 ENCOUNTER — Encounter (HOSPITAL_COMMUNITY): Payer: Self-pay | Admitting: Psychiatry

## 2013-05-19 DIAGNOSIS — F410 Panic disorder [episodic paroxysmal anxiety] without agoraphobia: Secondary | ICD-10-CM

## 2013-05-19 DIAGNOSIS — F411 Generalized anxiety disorder: Secondary | ICD-10-CM

## 2013-05-19 DIAGNOSIS — F332 Major depressive disorder, recurrent severe without psychotic features: Secondary | ICD-10-CM | POA: Diagnosis present

## 2013-05-19 LAB — URINE MICROSCOPIC-ADD ON

## 2013-05-19 LAB — URINALYSIS, ROUTINE W REFLEX MICROSCOPIC
Glucose, UA: NEGATIVE mg/dL
Ketones, ur: NEGATIVE mg/dL
Protein, ur: NEGATIVE mg/dL
Specific Gravity, Urine: 1.022 (ref 1.005–1.030)
Urobilinogen, UA: 1 mg/dL (ref 0.0–1.0)

## 2013-05-19 MED ORDER — MIRTAZAPINE 7.5 MG PO TABS
7.5000 mg | ORAL_TABLET | Freq: Every day | ORAL | Status: DC
  Administered 2013-05-19: 7.5 mg via ORAL
  Filled 2013-05-19 (×2): qty 1

## 2013-05-19 MED ORDER — MIRTAZAPINE 15 MG PO TABS
ORAL_TABLET | ORAL | Status: AC
  Filled 2013-05-19: qty 1

## 2013-05-19 MED ORDER — CLONAZEPAM 1 MG PO TABS
1.0000 mg | ORAL_TABLET | Freq: Three times a day (TID) | ORAL | Status: DC
  Administered 2013-05-19 – 2013-05-23 (×12): 1 mg via ORAL
  Filled 2013-05-19 (×12): qty 1

## 2013-05-19 MED ORDER — CIPROFLOXACIN HCL 500 MG PO TABS
500.0000 mg | ORAL_TABLET | Freq: Two times a day (BID) | ORAL | Status: AC
  Administered 2013-05-19 – 2013-05-22 (×6): 500 mg via ORAL
  Filled 2013-05-19 (×6): qty 1
  Filled 2013-05-19: qty 2

## 2013-05-19 MED ORDER — ARIPIPRAZOLE 2 MG PO TABS
2.0000 mg | ORAL_TABLET | Freq: Every day | ORAL | Status: DC
  Administered 2013-05-19 – 2013-05-23 (×5): 2 mg via ORAL
  Filled 2013-05-19 (×4): qty 1
  Filled 2013-05-19 (×2): qty 3
  Filled 2013-05-19 (×2): qty 1

## 2013-05-19 MED ORDER — DULOXETINE HCL 30 MG PO CPEP
30.0000 mg | ORAL_CAPSULE | Freq: Every day | ORAL | Status: DC
  Administered 2013-05-19 – 2013-05-23 (×5): 30 mg via ORAL
  Filled 2013-05-19 (×2): qty 3
  Filled 2013-05-19 (×6): qty 1

## 2013-05-19 MED ORDER — IBUPROFEN 800 MG PO TABS
800.0000 mg | ORAL_TABLET | Freq: Three times a day (TID) | ORAL | Status: DC | PRN
  Administered 2013-05-19 – 2013-05-22 (×6): 800 mg via ORAL
  Filled 2013-05-19 (×6): qty 1

## 2013-05-19 MED ORDER — ENSURE COMPLETE PO LIQD
237.0000 mL | Freq: Two times a day (BID) | ORAL | Status: DC
  Administered 2013-05-19 – 2013-05-22 (×4): 237 mL via ORAL

## 2013-05-19 MED ORDER — CLONAZEPAM 1 MG PO TABS: 1.0000 mg | ORAL_TABLET | Freq: Three times a day (TID) | ORAL | Status: DC

## 2013-05-19 NOTE — H&P (Signed)
Psychiatric Admission Assessment Adult  Patient Identification:  Paula Sosa Date of Evaluation:  05/19/2013 Chief Complaint:  DEPRESSIVE DISORDER NOS History of Present Illness:: 32 Y/O female who states she feels sad, hopless, useless. States she drinks three times a month. Smokes pot three times a week. Will be 32 in couple of weeks and her life is "shit." cant keep it together. Thi;nks about death often. She cant sleep, cant eat has lost a lot of weight. She has been able to control her alcohol use since she came out of rehab. She has had numerus medication trials but endorses that they have not been effective. She was on Lexapro bine switched to Wellbutrin. She is not sure if Wellbutrin was all that great in the past. She also has what seems to be premenstrual dysphoria when all her symptoms get worst.  Elements:  Location:  in patient. Quality:  unable to function. Severity:  severe. Timing:  every day. Duration:  building up last weeks, months. Context:  major depression, anxiety unable to achief remission, with some substance abuse. Associated Signs/Synptoms: Depression Symptoms:  depressed mood, anhedonia, hypersomnia, psychomotor retardation, fatigue, feelings of worthlessness/guilt, difficulty concentrating, hopelessness, impaired memory, recurrent thoughts of death, anxiety, panic attacks, loss of energy/fatigue, weight loss, decreased appetite, (Hypo) Manic Symptoms:  Irritable Mood, Labiality of Mood, Anxiety Symptoms:  Excessive Worry, Panic Symptoms, Psychotic Symptoms:  Paranoia, PTSD Symptoms: Negative  Psychiatric Specialty Exam: Physical Exam  Review of Systems  Constitutional: Positive for weight loss and malaise/fatigue.  Eyes: Negative.   Respiratory:       Half a pack a day  Cardiovascular: Positive for palpitations.  Gastrointestinal: Negative.   Genitourinary: Negative.   Musculoskeletal: Negative.   Skin: Negative.   Neurological:  Positive for weakness and headaches.  Endo/Heme/Allergies: Negative.   Psychiatric/Behavioral: Positive for depression and substance abuse. The patient is nervous/anxious and has insomnia.     Blood pressure 107/72, pulse 84, temperature 98.3 F (36.8 C), temperature source Oral, resp. rate 16, height 5' 7.72" (1.72 m), weight 51.71 kg (114 lb), last menstrual period 05/18/2013.Body mass index is 17.48 kg/(m^2).  General Appearance: Disheveled  Eye Contact::  Minimal  Speech:  Clear and Coherent, Slow and not spontaneous  Volume:  Decreased  Mood:  Anxious and Depressed  Affect:  Tearful and depressed, cant stop crying  Thought Process:  Coherent and Goal Directed  Orientation:  Full (Time, Place, and Person)  Thought Content:  symptoms, worries, concerns, a sense of hoplessness, helplessness  Suicidal Thoughts:  No  Homicidal Thoughts:  No  Memory:  Immediate;   Poor Recent;   Poor Remote;   Fair  Judgement:  Fair  Insight:  Present  Psychomotor Activity:  Restlessness  Concentration:  Poor  Recall:  Poor  Akathisia:  No  Handed:    AIMS (if indicated):     Assets:  Desire for Improvement  Sleep:  Number of Hours: 6.75    Past Psychiatric History: Diagnosis:  Hospitalizations:[facility],   Outpatient Care: [facility] used to see Vernell Leep, [facility]  Substance Abuse Care: [facility]  Self-Mutilation: Denies  Suicidal Attempts: Denies  Violent Behaviors: When drinking a lot   Past Medical History:   Past Medical History  Diagnosis Date  . Depression   . MRSA (methicillin resistant staph aureus) culture positive     Allergies:   Allergies  Allergen Reactions  . Penicillins Rash   PTA Medications: Prescriptions prior to admission  Medication Sig Dispense Refill  .  amphetamine-dextroamphetamine (ADDERALL) 30 MG tablet Take 30 mg by mouth daily.      Marland Kitchen buPROPion (WELLBUTRIN SR) 100 MG 12 hr tablet Take 100 mg by mouth 2 (two) times daily.      .  clonazePAM (KLONOPIN) 0.5 MG tablet Take 1 mg by mouth 3 (three) times daily as needed for anxiety.       Marland Kitchen escitalopram (LEXAPRO) 20 MG tablet Take 20 mg by mouth daily.      Marland Kitchen HYDROcodone-acetaminophen (NORCO/VICODIN) 5-325 MG per tablet Take 1-2 tablets by mouth every 6 (six) hours as needed for pain.  20 tablet  0  . ibuprofen (ADVIL,MOTRIN) 200 MG tablet Take 400 mg by mouth every 6 (six) hours as needed for pain. For pain      . Tetrahydrozoline HCl (VISINE OP) Place 1 drop into both eyes daily as needed (itchy eyes). Itchy eyes        Previous Psychotropic Medications:  Medication/Dose  Wellbutrin a week, being weaned off Lexapro, Adderall 30 mg BID, Klonopin 1 mg TID  Prozac, Celexa, Zoloft, Paxil, Lamictal              Substance Abuse History in the last 12 months:  yes  Consequences of Substance Abuse: Legal Consequences:  DWI  Social History:  reports that she has been smoking Cigarettes.  She has been smoking about 0.50 packs per day. She has never used smokeless tobacco. She reports that she uses illicit drugs (Marijuana). She reports that she does not drink alcohol. Additional Social History:                      Current Place of Residence:  Lives by herself Place of Birth:   Family Members: Marital Status:  Single Children: None  Sons:  Daughters: Relationships: Education:  some college Educational Problems/Performance: Religious Beliefs/Practices: Not currently History of Abuse (Emotional/Phsycial/Sexual) Denies Armed forces technical officer; IT sales professional History:  None. Legal History:Denies Hobbies/Interests:  Family History:  History reviewed. No pertinent family history.                             Mother agoraphobia, severe OCD, depression anxiety  Results for orders placed during the hospital encounter of 05/18/13 (from the past 72 hour(s))  CBC     Status: None   Collection Time    05/18/13  1:05 PM      Result Value Range   WBC 6.7   4.0 - 10.5 K/uL   RBC 4.55  3.87 - 5.11 MIL/uL   Hemoglobin 13.9  12.0 - 15.0 g/dL   HCT 16.1  09.6 - 04.5 %   MCV 89.7  78.0 - 100.0 fL   MCH 30.5  26.0 - 34.0 pg   MCHC 34.1  30.0 - 36.0 g/dL   RDW 40.9  81.1 - 91.4 %   Platelets 232  150 - 400 K/uL  COMPREHENSIVE METABOLIC PANEL     Status: Abnormal   Collection Time    05/18/13  1:05 PM      Result Value Range   Sodium 141  135 - 145 mEq/L   Potassium 3.7  3.5 - 5.1 mEq/L   Chloride 105  96 - 112 mEq/L   CO2 25  19 - 32 mEq/L   Glucose, Bld 119 (*) 70 - 99 mg/dL   BUN 16  6 - 23 mg/dL   Creatinine, Ser 7.82  0.50 - 1.10 mg/dL  Calcium 9.5  8.4 - 10.5 mg/dL   Total Protein 6.6  6.0 - 8.3 g/dL   Albumin 3.7  3.5 - 5.2 g/dL   AST 13  0 - 37 U/L   ALT 12  0 - 35 U/L   Alkaline Phosphatase 48  39 - 117 U/L   Total Bilirubin 0.2 (*) 0.3 - 1.2 mg/dL   GFR calc non Af Amer >90  >90 mL/min   GFR calc Af Amer >90  >90 mL/min   Comment: (NOTE)     The eGFR has been calculated using the CKD EPI equation.     This calculation has not been validated in all clinical situations.     eGFR's persistently <90 mL/min signify possible Chronic Kidney     Disease.  ETHANOL     Status: None   Collection Time    05/18/13  1:05 PM      Result Value Range   Alcohol, Ethyl (B) <11  0 - 11 mg/dL   Comment:            LOWEST DETECTABLE LIMIT FOR     SERUM ALCOHOL IS 11 mg/dL     FOR MEDICAL PURPOSES ONLY  POCT PREGNANCY, URINE     Status: None   Collection Time    05/18/13  1:38 PM      Result Value Range   Preg Test, Ur NEGATIVE  NEGATIVE   Comment:            THE SENSITIVITY OF THIS     METHODOLOGY IS >24 mIU/mL  URINE RAPID DRUG SCREEN (HOSP PERFORMED)     Status: Abnormal   Collection Time    05/18/13  1:40 PM      Result Value Range   Opiates NONE DETECTED  NONE DETECTED   Cocaine NONE DETECTED  NONE DETECTED   Benzodiazepines POSITIVE (*) NONE DETECTED   Amphetamines POSITIVE (*) NONE DETECTED   Tetrahydrocannabinol POSITIVE  (*) NONE DETECTED   Barbiturates NONE DETECTED  NONE DETECTED   Comment:            DRUG SCREEN FOR MEDICAL PURPOSES     ONLY.  IF CONFIRMATION IS NEEDED     FOR ANY PURPOSE, NOTIFY LAB     WITHIN 5 DAYS.                LOWEST DETECTABLE LIMITS     FOR URINE DRUG SCREEN     Drug Class       Cutoff (ng/mL)     Amphetamine      1000     Barbiturate      200     Benzodiazepine   200     Tricyclics       300     Opiates          300     Cocaine          300     THC              50   Psychological Evaluations:  Assessment:   DSM5:  Schizophrenia Disorders:   Obsessive-Compulsive Disorders:   Trauma-Stressor Disorders:   Substance/Addictive Disorders:   Depressive Disorders:  Major Depressive Disorder - Severe (296.23)  AXIS I:  Generalized Anxiety Disorder and Panic Disorder AXIS II:  Deferred AXIS III:   Past Medical History  Diagnosis Date  . Depression   . MRSA (methicillin resistant staph aureus) culture positive    AXIS  IV:  other psychosocial or environmental problems AXIS V:  41-50 serious symptoms  Treatment Plan/Recommendations:  Supportive approach/coping skills                              Will continue the klonopin as he is being prescribed it and her provider will continue to prescribe it for her                              Will D/C the Wellbutrin                             She has never been tried on a Dual agent will try Cymbalta 30 mg daily with plans to increase to 60 mg                              Will augment with Abilify 2 mg daily                              Will keep Klonopin 1 mg TID                              Will try Remeron 7.5 mg at HS                                Treatment Plan Summary: Daily contact with patient to assess and evaluate symptoms and progress in treatment Medication management Current Medications:  Current Facility-Administered Medications  Medication Dose Route Frequency Provider Last Rate Last Dose  .  acetaminophen (TYLENOL) tablet 650 mg  650 mg Oral Q6H PRN Shuvon Rankin, NP      . alum & mag hydroxide-simeth (MAALOX/MYLANTA) 200-200-20 MG/5ML suspension 30 mL  30 mL Oral Q4H PRN Shuvon Rankin, NP      . hydrOXYzine (ATARAX/VISTARIL) tablet 25 mg  25 mg Oral Q6H PRN Fransisca Kaufmann, NP      . magnesium hydroxide (MILK OF MAGNESIA) suspension 30 mL  30 mL Oral Daily PRN Shuvon Rankin, NP      . traZODone (DESYREL) tablet 50 mg  50 mg Oral QHS PRN,MR X 1 Fransisca Kaufmann, NP        Observation Level/Precautions:  15 minute checks  Laboratory:  As per the ED  Psychotherapy:  Individual/group  Medications:  As above  Consultations:    Discharge Concerns:    Estimated LOS: 3-5 days  Other:     I certify that inpatient services furnished can reasonably be expected to improve the patient's condition.   Adeel Guiffre A 10/15/201410:26 AM

## 2013-05-19 NOTE — Progress Notes (Signed)
Nutrition Brief Note  Pt meets criteria for severe MALNUTRITION in the context of chronic illness as evidenced by <75% estimated energy intake with 8% weight loss in the past month per pt report.  Patient identified on the Malnutrition Screening Tool (MST) Report.  Wt Readings from Last 10 Encounters:  05/18/13 114 lb (51.71 kg)  11/21/12 120 lb (54.432 kg)  08/07/12 126 lb 3.2 oz (57.244 kg)  09/26/11 150 lb (68.04 kg)  04/21/11 150 lb (68.04 kg)   Body mass index is 17.48 kg/(m^2). Patient meets criteria for underweight based on current BMI.   Discussed intake PTA with patient and compared to intake presently.  Discussed changes in intake and encouraged adequate intake of meals and snacks. Met with pt who reports poor intake for the past month with 10 pound unintended weight loss. Pt states she was just eating snacks or 1 meal/day. Pt able to eat some chicken and vegetables today.   Current diet order is regular and pt is also offered choice of unit snacks mid-morning and mid-afternoon.  Pt is eating as desired.   Labs and medications reviewed.   Nutrition Dx:  Unintended wt change r/t suboptimal oral intake AEB pt report  Interventions:   Discussed the importance of nutrition and encouraged intake of food and beverages.     Supplements: Ensure Complete BID  No additional nutrition interventions warranted at this time. If nutrition issues arise, please consult RD.   Levon Hedger MS, RD, LDN [phone] Pager [phone] After Hours Pager

## 2013-05-19 NOTE — BHH Group Notes (Signed)
[facility] LCSW Group Therapy  05/19/2013  1:15 PM   Type of Therapy:  Group Therapy  Participation Level:  Did Not Attend  Reyes Ivan, LCSWA 05/19/2013 2:35 PM

## 2013-05-19 NOTE — Progress Notes (Signed)
Patient ID: Paula Sosa, female   DOB: 1980-11-28, 32 y.o.   MRN: [id number] She was tearful crying this AM saying that she just did not want to live anymore, that she would not harm herself but she didn't want to be here. Self inventory: depressed and hopelessness at 10, denies craving, Just wishes she were dead. She has been stated on her medications and she went to lunch and at present she is asleep.

## 2013-05-19 NOTE — BHH Group Notes (Signed)
[facility] LCSW Aftercare Discharge Planning Group Note   05/19/2013 8:45 AM  Participation Quality:  Alert and Appropriate   Mood/Affect:  Appropriate, Tearful and Depressed  Depression Rating:  10  Anxiety Rating:  8  Thoughts of Suicide:  Pt denies SI/HI  Will you contract for safety?   Yes  Current AVH:  Pt denies  Plan for Discharge/Comments:  Pt attended discharge planning group and actively participated in group.  CSW provided pt with today's workbook.  Pt states that she came to the hospital for depression.  Pt states that she lives in [facility] and can return to home there.  Pt states that she goes to [facility] for medication management.  Pt is also open to going there for therapy as well.  CSW will secure pt's follow up there.  No further needs voiced by pt at this time.    Transportation Means: Pt reports access to transportation  Supports: No supports mentioned at this time  Reyes Ivan, LCSWA 05/19/2013 9:46 AM

## 2013-05-19 NOTE — BHH Suicide Risk Assessment (Signed)
Suicide Risk Assessment  Admission Assessment     Nursing information obtained from:  Patient Demographic factors:  Living alone;Low socioeconomic status;Caucasian Current Mental Status:  Suicidal ideation indicated by patient;Self-harm behaviors Loss Factors:  Financial problems / change in socioeconomic status Historical Factors:  NA Risk Reduction Factors:  Positive social support;Employed  CLINICAL FACTORS:   Depression:   Comorbid alcohol abuse/dependence Alcohol/Substance Abuse/Dependencies  COGNITIVE FEATURES THAT CONTRIBUTE TO RISK:  Closed-mindedness Polarized thinking Thought constriction (tunnel vision)    SUICIDE RISK:   Moderate:  Frequent suicidal ideation with limited intensity, and duration, some specificity in terms of plans, no associated intent, good self-control, limited dysphoria/symptomatology, some risk factors present, and identifiable protective factors, including available and accessible social support.  PLAN OF CARE: Supportive approach/coping skills                               Cymbalta/Abilify/Remeron HS/Klonopin                                CBT/mindfulness  I certify that inpatient services furnished can reasonably be expected to improve the patient's condition.  Mariaisabel Bodiford A 05/19/2013, 5:04 PM

## 2013-05-19 NOTE — Progress Notes (Signed)
Attended NA meeting

## 2013-05-19 NOTE — Progress Notes (Signed)
Notified provider of abnormal ua and received orders to begin cipro and ibuprophen  Medications were administered around 2239
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
## 2013-05-19 NOTE — Tx Team (Signed)
Interdisciplinary Treatment Plan Update (Adult)  Date: 05/19/2013  Time Reviewed:  9:45 AM  Progress in Treatment: Attending groups: Yes Participating in groups:  Yes Taking medication as prescribed:  Yes Tolerating medication:  Yes Family/Significant othe contact made: CSW assessing Paula Sosa understands diagnosis:  Yes Discussing Paula Sosa identified problems/goals with staff:  Yes Medical problems stabilized or resolved:  Yes Denies suicidal/homicidal ideation: Yes Issues/concerns per Paula Sosa self-inventory:  Yes Other:  New problem(s) identified: N/A  Discharge Plan or Barriers: CSW assessing for appropriate referrals.    Reason for Continuation of Hospitalization: Anxiety Depression Detox Medication Stabilization  Comments: N/A  Estimated length of stay: 3-5 days  For review of initial/current Paula Sosa goals, please see plan of care.  Attendees: Paula Sosa:     Family:     Physician:  Dr. Dub Mikes 05/19/2013 10:16 AM   Nursing:   Roswell Miners, RN 05/19/2013 10:16 AM   Clinical Social Worker:  Reyes Ivan, LCSWA 05/19/2013 10:16 AM   Other: Onnie Boer, RN case manager 05/19/2013 10:16 AM   Other:  Trula Slade, LCSWA 05/19/2013 10:16 AM   Other:  Serena Colonel, NP 05/19/2013 10:16 AM   Other:  Onnie Boer, RN case manager 05/19/2013 10:25 AM   Other: Murtis Sink, RN 05/19/2013 10:26 AM   Other:    Other:    Other:    Other:    Other:     Scribe for Treatment Team:   Carmina Miller, 05/19/2013 , 10:16 AM

## 2013-05-19 NOTE — Progress Notes (Signed)
D   Pt is sad and tearful during our conversation   She reports her back really hurts and her urine smells like it did last time she had a UTI    She is depressed and anxious and reports she would not try to harm herself but feels like she would be better off dead   She has not attended groups   A   Verbal support given   Medications administered and effectiveness monitored  Q 15 min checks   Encourage group attendance and socialization    Collected specimen for UA and encouraged pt to drink a lot of water B   Pt safe at present

## 2013-05-20 DIAGNOSIS — F411 Generalized anxiety disorder: Secondary | ICD-10-CM

## 2013-05-20 MED ORDER — MIRTAZAPINE 15 MG PO TABS
15.0000 mg | ORAL_TABLET | Freq: Every day | ORAL | Status: DC
  Administered 2013-05-20 – 2013-05-22 (×3): 15 mg via ORAL
  Filled 2013-05-20 (×2): qty 3
  Filled 2013-05-20 (×4): qty 1

## 2013-05-20 MED ORDER — PHENAZOPYRIDINE HCL 200 MG PO TABS
200.0000 mg | ORAL_TABLET | Freq: Three times a day (TID) | ORAL | Status: DC
  Administered 2013-05-20 – 2013-05-23 (×9): 200 mg via ORAL
  Filled 2013-05-20 (×18): qty 1

## 2013-05-20 MED ORDER — NICOTINE 14 MG/24HR TD PT24
14.0000 mg | MEDICATED_PATCH | Freq: Every day | TRANSDERMAL | Status: DC
  Administered 2013-05-20 – 2013-05-22 (×3): 14 mg via TRANSDERMAL
  Filled 2013-05-20 (×4): qty 1

## 2013-05-20 MED ORDER — CIPROFLOXACIN HCL 500 MG PO TABS: 500.0000 mg | ORAL_TABLET | Freq: Two times a day (BID) | ORAL | Status: DC

## 2013-05-20 MED ORDER — NICOTINE 14 MG/24HR TD PT24
MEDICATED_PATCH | TRANSDERMAL | Status: AC
  Administered 2013-05-20: 14 mg via TRANSDERMAL
  Filled 2013-05-20: qty 1

## 2013-05-20 NOTE — Progress Notes (Signed)
[facility] MD Progress Note  05/20/2013 5:04 PM SABRIN DUNLEVY  MRN:  [id number] Subjective:  Billi continues to struggle. She endorses she is very depressed, did not sleep well last night, she is having symptoms suggesting UTI (3 months ago had pyelonephritis) She is hardly eating. She is trying to get her life together but admits she feels hopeless, helpless.  Diagnosis:   DSM5: Schizophrenia Disorders:   Obsessive-Compulsive Disorders:   Trauma-Stressor Disorders:   Substance/Addictive Disorders:   Depressive Disorders:  Major Depressive Disorder - Severe (296.23)  Axis I: Anxiety Disorder NOS  ADL's:  Intact  Sleep: Poor  Appetite:  Poor  Suicidal Ideation:  Plan:  denies Intent:  denies Means:  denies Homicidal Ideation:  Plan:  denies Intent:  denies Means:  denies AEB (as evidenced by):  Psychiatric Specialty Exam: Review of Systems  Constitutional: Positive for weight loss and malaise/fatigue.  HENT: Negative.   Eyes: Negative.   Respiratory: Negative.   Cardiovascular: Negative.   Gastrointestinal: Positive for abdominal pain.  Genitourinary: Positive for dysuria and flank pain.  Musculoskeletal: Positive for back pain.  Skin: Negative.   Neurological: Positive for dizziness and weakness.  Endo/Heme/Allergies: Negative.   Psychiatric/Behavioral: Positive for depression. The patient is nervous/anxious and has insomnia.     Blood pressure 127/80, pulse 79, temperature 97.8 F (36.6 C), temperature source Oral, resp. rate 16, height 5' 7.72" (1.72 m), weight 51.71 kg (114 lb), last menstrual period 05/18/2013.Body mass index is 17.48 kg/(m^2).  General Appearance: Fairly Groomed  Patent attorney::  Fair  Speech:  Clear and Coherent  Volume:  Decreased  Mood:  Anxious and Depressed  Affect:  Depressed and Tearful  Thought Process:  Coherent and Goal Directed  Orientation:  Full (Time, Place, and Person)  Thought Content:  worries, concerns  Suicidal Thoughts:  No   Homicidal Thoughts:  No  Memory:  Immediate;   Fair Recent;   Fair Remote;   Fair  Judgement:  Fair  Insight:  Present  Psychomotor Activity:  Restlessness  Concentration:  Poor  Recall:  Fair  Akathisia:  No  Handed:    AIMS (if indicated):     Assets:  Desire for Improvement Social Support  Sleep:  Number of Hours: 6.75   Current Medications: Current Facility-Administered Medications  Medication Dose Route Frequency Provider Last Rate Last Dose  . acetaminophen (TYLENOL) tablet 650 mg  650 mg Oral Q6H PRN Shuvon Rankin, NP   650 mg at 05/19/13 1816  . alum & mag hydroxide-simeth (MAALOX/MYLANTA) 200-200-20 MG/5ML suspension 30 mL  30 mL Oral Q4H PRN Shuvon Rankin, NP      . ARIPiprazole (ABILIFY) tablet 2 mg  2 mg Oral Daily Rachael Fee, MD   2 mg at 05/20/13 0846  . ciprofloxacin (CIPRO) tablet 500 mg  500 mg Oral BID Jorje Guild, PA-C   500 mg at 05/20/13 0845  . clonazePAM (KLONOPIN) tablet 1 mg  1 mg Oral TID Rachael Fee, MD   1 mg at 05/20/13 1153  . DULoxetine (CYMBALTA) DR capsule 30 mg  30 mg Oral Daily Rachael Fee, MD   30 mg at 05/20/13 0845  . feeding supplement (ENSURE COMPLETE) (ENSURE COMPLETE) liquid 237 mL  237 mL Oral BID BM Lavena Bullion, RD   237 mL at 05/20/13 1437  . hydrOXYzine (ATARAX/VISTARIL) tablet 25 mg  25 mg Oral Q6H PRN Fransisca Kaufmann, NP      . ibuprofen (ADVIL,MOTRIN) tablet 800 mg  800  mg Oral TID PRN Jorje Guild, PA-C   800 mg at 05/20/13 1437  . magnesium hydroxide (MILK OF MAGNESIA) suspension 30 mL  30 mL Oral Daily PRN Shuvon Rankin, NP      . mirtazapine (REMERON) tablet 15 mg  15 mg Oral QHS Rachael Fee, MD      . phenazopyridine (PYRIDIUM) tablet 200 mg  200 mg Oral TID WC Verne Spurr, PA-C   200 mg at 05/20/13 1153    Lab Results:  Results for orders placed during the hospital encounter of 05/18/13 (from the past 48 hour(s))  URINALYSIS, ROUTINE W REFLEX MICROSCOPIC     Status: Abnormal   Collection Time    05/19/13  4:43 PM       Result Value Range   Color, Urine YELLOW  YELLOW   APPearance CLOUDY (*) CLEAR   Specific Gravity, Urine 1.022  1.005 - 1.030   pH 6.0  5.0 - 8.0   Glucose, UA NEGATIVE  NEGATIVE mg/dL   Hgb urine dipstick MODERATE (*) NEGATIVE   Bilirubin Urine NEGATIVE  NEGATIVE   Ketones, ur NEGATIVE  NEGATIVE mg/dL   Protein, ur NEGATIVE  NEGATIVE mg/dL   Urobilinogen, UA 1.0  0.0 - 1.0 mg/dL   Nitrite POSITIVE (*) NEGATIVE   Leukocytes, UA MODERATE (*) NEGATIVE   Comment: Performed at [facility]  URINE MICROSCOPIC-ADD ON     Status: Abnormal   Collection Time    05/19/13  4:43 PM      Result Value Range   Squamous Epithelial / LPF RARE  RARE   WBC, UA 21-50  <3 WBC/hpf   RBC / HPF 0-2  <3 RBC/hpf   Bacteria, UA MANY (*) RARE   Crystals CA OXALATE CRYSTALS (*) NEGATIVE   Comment: Performed at [facility]    Physical Findings: AIMS: Facial and Oral Movements Muscles of Facial Expression: None, normal Lips and Perioral Area: None, normal Jaw: None, normal Tongue: None, normal,Extremity Movements Upper (arms, wrists, hands, fingers): None, normal Lower (legs, knees, ankles, toes): None, normal, Trunk Movements Neck, shoulders, hips: None, normal, Overall Severity Severity of abnormal movements (highest score from questions above): None, normal Incapacitation due to abnormal movements: None, normal Patient's awareness of abnormal movements (rate only patient's report): No Awareness, Dental Status Current problems with teeth and/or dentures?: No Does patient usually wear dentures?: No  CIWA:  CIWA-Ar Total: 4 COWS:  COWS Total Score: 3  Treatment Plan Summary: Daily contact with patient to assess and evaluate symptoms and progress in treatment Medication management  Plan: Supportive approach/coping skills           Increase the Remeron to 15 mg HS           Opitmize treatment with the Abilify, Cymbalta, Klonopin           CBT/help identify and  address the cognitive distortions           Treat the UTI Medical Decision Making Problem Points:  Review of psycho-social stressors (1) Data Points:  Review or order clinical lab tests (1) Review or order medicine tests (1) Review of medication regiment & side effects (2) Review of new medications or change in dosage (2)  I certify that inpatient services furnished can reasonably be expected to improve the patient's condition.   Jiayi Lengacher A 05/20/2013, 5:04 PM

## 2013-05-20 NOTE — Progress Notes (Signed)
Pt. Is resting quietly/ resp even unlabored/ no signs of distress or discomfort noted.

## 2013-05-20 NOTE — Progress Notes (Signed)
[facility] Group Notes:  (Nursing/MHT/Case Management/Adjunct)  Date:  05/20/2013  Time:  10:22 AM  Type of Therapy:  AA Meeting   Summary of Progress/Problems: Pt attended the AA speaker group.  Iliya Spivack C 05/20/2013, 10:22 AM 

## 2013-05-20 NOTE — Progress Notes (Signed)
Patient did attend the evening karaoke group. Pt was attentive and supportive but did not sing a song.     

## 2013-05-20 NOTE — [facility] Group Notes (Signed)
[facility] LCSW Group Therapy  05/20/2013  1:15 PM   Type of Therapy:  Group Therapy  Participation Level:  Active  Participation Quality:  Appropriate and Attentive  Affect:  Appropriate, Flat and Depressed  Cognitive:  Alert and Appropriate  Insight:  Developing/Improving and Engaged  Engagement in Therapy:  Developing/Improving and Engaged  Modes of Intervention:  Clarification, Confrontation, Discussion, Education, Exploration, Limit-setting, Orientation, Problem-solving, Rapport Building, Dance movement psychotherapist, Socialization and Support  Summary of Progress/Problems: The topic for group was balance in life.  Today's group focused on defining balance in one's own words, identifying things that can knock one off balance, and exploring healthy ways to maintain balance in life. Group members were asked to provide an example of a time when they felt off balance, describe how they handled that situation,and process healthier ways to regain balance in the future. Group members were asked to share the most important tool for maintaining balance that they learned while at [facility] and how they plan to apply this method after discharge.  Pt shared that balance to her means "stability" which would mean being able to pay bills and care for herself.  Pt states that her life is off balance when she focuses on other people's needs.  Pt discussed feeling like she would never amount to anything and feeling hopeless.  Pt actively participated and was engaged in group discussion.    Reyes Ivan, LCSWA 05/20/2013 2:44 PM

## 2013-05-20 NOTE — Consult Note (Signed)
Note reviewed and agreed with  

## 2013-05-20 NOTE — [facility] Suicide Risk Assessment (Signed)
[facility] INPATIENT:  Family/Significant Other Suicide Prevention Education  Suicide Prevention Education:  Education Completed; Starkeisha Vanwinkle - mother [phone]),  (name of family member/significant other) has been identified by the patient as the family member/significant other with whom the patient will be residing, and identified as the person(s) who will aid the patient in the event of a mental health crisis (suicidal ideations/suicide attempt).  With written consent from the patient, the family member/significant other has been provided the following suicide prevention education, prior to the and/or following the discharge of the patient.  The suicide prevention education provided includes the following:  Suicide risk factors  Suicide prevention and interventions  National Suicide Hotline telephone number  [facility] assessment telephone number  [facility] Emergency Assistance 911  [facility] and/or Residential Mobile Crisis Unit telephone number  Request made of family/significant other to:  Remove weapons (e.g., guns, rifles, knives), all items previously/currently identified as safety concern.    Remove drugs/medications (over-the-counter, prescriptions, illicit drugs), all items previously/currently identified as a safety concern.  The family member/significant other verbalizes understanding of the suicide prevention education information provided.  The family member/significant other agrees to remove the items of safety concern listed above.  Carmina Miller 05/20/2013, 10:57 AM

## 2013-05-20 NOTE — Progress Notes (Signed)
Adult Psychoeducational Group Note  Date:  05/20/2013 Time:  10:11 AM  Group Topic/Focus:  Orientation:   The focus of this group is to educate the patient on the purpose and policies of crisis stabilization and provide a format to answer questions about their admission.  The group details unit policies and expectations of patients while admitted.  Participation Level:  Minimal  Participation Quality:  Appropriate  Affect:  Appropriate  Cognitive:  Appropriate  Insight: Appropriate  Engagement in Group:  Engaged  Modes of Intervention:  Orientation  Additional Comments:  Pt participated in the morning exercises with the group. Pt stated her goal for today is to not sleep all day.  Caswell Corwin 05/20/2013, 10:11 AM

## 2013-05-20 NOTE — Progress Notes (Signed)
Patient ID: Paula Sosa, female   DOB: 1980/12/13, 32 y.o.   MRN: [id number] She has been up and to groups. Has been keeping mostly to herself. Self inventory: depression 9, hopelessness 10, denies SI thoughts to harm herself but would not like not to be here. Said that she did not sleep well last night but she plans to stay awake today and attend groups.

## 2013-05-20 NOTE — Progress Notes (Signed)
Recreation Therapy Notes  Date: 10.16.2014 Time: 3:00pm Location: [address] Dayroom   Group Topic: Communication, Team Building, Problem Solving  Goal Area(s) Addresses:  Patient will effectively work with peer towards shared goal.  Patient will identify skill used to make activity successful.  Patient will identify how skills used during activity can be used to reach post d/c goals.   Behavioral Response: Did not attend  Raynah Gomes L Sunaina Ferrando, LRT/CTRS  Joelynn Dust L 05/20/2013 5:19 PM 

## 2013-05-21 DIAGNOSIS — F988 Other specified behavioral and emotional disorders with onset usually occurring in childhood and adolescence: Secondary | ICD-10-CM

## 2013-05-21 LAB — URINE CULTURE: Colony Count: >=100000

## 2013-05-21 MED ORDER — AMPHETAMINE-DEXTROAMPHETAMINE 10 MG PO TABS
20.0000 mg | ORAL_TABLET | Freq: Two times a day (BID) | ORAL | Status: DC
  Administered 2013-05-21 – 2013-05-23 (×4): 20 mg via ORAL
  Filled 2013-05-21 (×4): qty 2

## 2013-05-21 MED ORDER — AMPHETAMINE-DEXTROAMPHETAMINE 10 MG PO TABS: 20.0000 mg | ORAL_TABLET | Freq: Two times a day (BID) | ORAL | Status: DC

## 2013-05-21 NOTE — Progress Notes (Signed)
Adult Psychoeducational Group Note  Date:  05/21/2013 Time:  2:35 PM  Group Topic/Focus:  Recovery Goals:   The focus of this group is to identify appropriate goals for recovery and establish a plan to achieve them. Relapse Prevention Planning:   The focus of this group is to define relapse and discuss the need for planning to combat relapse.  Participation Level:  Active  Participation Quality:  Appropriate and Attentive  Affect:  Appropriate  Cognitive:  Alert and Appropriate  Insight: Appropriate and Good  Engagement in Group:  Engaged  Modes of Intervention:  Discussion and Education  Additional Comments:  Group focused on the journey to recovery and shared openly with the group.  Reynolds Bowl 05/21/2013, 2:35 PM

## 2013-05-21 NOTE — [facility] Group Notes (Signed)
Adult Psychoeducational Group Note  Date:  05/21/2013 Time:  11:00 PM  Group Topic/Focus:  Wrap-Up Group:   The focus of this group is to help patients review their daily goal of treatment and discuss progress on daily workbooks.  Participation Level:  Minimal  Participation Quality:  Appropriate  Affect:  Appropriate  Cognitive:  Appropriate  Insight: Appropriate  Engagement in Group:  Limited  Modes of Intervention:  Discussion  Additional Comments:  Jalonda said her day was very good.  He went outside for the first time since being here and she is feeling hopeful.  She stated her coping skill was attempting not to worry about things she can't change.  Caroll Rancher A 05/21/2013, 11:00 PM

## 2013-05-21 NOTE — Progress Notes (Signed)
Patient ID: Paula Sosa, female   DOB: Dec 15, 1980, 32 y.o.   MRN: [id number] 05-21-13 @ 1005 nursing shift note: D: pt came to the window for medications this am. She is going to groups, taking her medications and not having any adverse effects. She denied any SI. A: RN is monitoring her temp per order due to recent sepsis with pyelonephritis. She took some ibuprofen this am for flank pain and it was effective. R: on her inventory sheet she wrote slept well, appetite improving, energy low, attention poor with her depression at 6 and her hopelessness at 5. After d/c she plans to " let someone know when I begin to feel sad or out of control". RN will monitor and Q 15 min ck's continue.

## 2013-05-21 NOTE — Progress Notes (Signed)
D.  Pt pleasant, bright on approach, denies complaints at this time.  Positive for evening AA group.  Interacting appropriately within milieu.  Denies SI/HI/hallucinations at this time.  A.  Support and encouragement offered  R.  Pt remains safe, will continue to monitor.

## 2013-05-21 NOTE — [facility] Group Notes (Signed)
[facility] LCSW Aftercare Discharge Planning Group Note   05/21/2013 8:45 AM  Participation Quality:  Alert and Appropriate   Mood/Affect:  Appropriate and Calm  Depression Rating:  4  Anxiety Rating:  5  Thoughts of Suicide:  Pt denies SI/HI  Will you contract for safety?   Yes  Current AVH:  Pt denies  Plan for Discharge/Comments:  Pt attended discharge planning group and actively participated in group.  CSW provided pt with today's workbook.  Pt reports feeling much better today and was observed smiling throughout group.  Pt states that she is feeling less hopeless today.  Pt will return home in [place] and has follow up scheduled at [facility] and [facility] for medication management and therapy.  No further needs voiced by pt at this time.    Transportation Means: Pt reports access to transportation  Supports: No supports mentioned at this time  Reyes Ivan, LCSWA 05/21/2013 9:49 AM

## 2013-05-21 NOTE — Progress Notes (Signed)
Assumed care of pt at 2300. VS have been obtained q4 hrs per order but no temp readings documented. Pt awakened for temp that is 98 orally. No complaints at that time. Continues to rest in bed. No distress. Level III obs in place for safety and pt is safe. Lawrence Marseilles

## 2013-05-21 NOTE — Progress Notes (Signed)
[facility] MD Progress Note  05/21/2013 8:27 PM SIMI BRIEL  MRN:  [id number] Subjective:  Paula Sosa did sleep better last night. She states she is having a hard time with attention span, distractibility. She states she tries to pay attention but she is not able to do so and gets very frustrated. She is trying to eat. She is taking the Ensure Still depressed. She is trying to open up and share in group. Still dealing with a sense of being overwhelmed, frustrated with her life Diagnosis:   DSM5: Schizophrenia Disorders:   Obsessive-Compulsive Disorders:   Trauma-Stressor Disorders:   Substance/Addictive Disorders:   Depressive Disorders:  Major Depressive Disorder - Severe (296.23)  Axis I: ADHD, inattentive type and Anxiety Disorder NOS  ADL's:  Intact  Sleep: Fair  Appetite:  Fair  Suicidal Ideation:  Plan:  denies Intent:  denies Means:  denies Homicidal Ideation:  Plan:  denies Intent:  denies Means:  denies AEB (as evidenced by):  Psychiatric Specialty Exam: Review of Systems  Constitutional: Positive for weight loss.  HENT: Negative.   Eyes: Negative.   Respiratory: Negative.   Cardiovascular: Negative.   Gastrointestinal: Negative.   Genitourinary: Negative.   Musculoskeletal: Negative.   Skin: Negative.   Neurological: Positive for weakness.  Endo/Heme/Allergies: Negative.   Psychiatric/Behavioral: Positive for depression. The patient is nervous/anxious and has insomnia.     Blood pressure 117/84, pulse 107, temperature 98 F (36.7 C), temperature source Oral, resp. rate 18, height 5' 7.72" (1.72 m), weight 51.71 kg (114 lb), last menstrual period 05/18/2013, SpO2 99.00%.Body mass index is 17.48 kg/(m^2).  General Appearance: Fairly Groomed  Patent attorney::  Fair  Speech:  Clear and Coherent  Volume:  Decreased  Mood:  Anxious and Depressed  Affect:  anxious, depressed  Thought Process:  Coherent and Goal Directed  Orientation:  Full (Time, Place, and Person)   Thought Content:  Rumination and worries, concerns, symptoms  Suicidal Thoughts:  No  Homicidal Thoughts:  No  Memory:  Immediate;   Fair Recent;   Fair Remote;   Fair  Judgement:  Fair  Insight:  Present  Psychomotor Activity:  Restlessness  Concentration:  Fair  Recall:  Fair  Akathisia:  No  Handed:    AIMS (if indicated):     Assets:  Desire for Improvement Social Support  Sleep:  Number of Hours: 6.75   Current Medications: Current Facility-Administered Medications  Medication Dose Route Frequency Provider Last Rate Last Dose  . acetaminophen (TYLENOL) tablet 650 mg  650 mg Oral Q6H PRN Shuvon Rankin, NP   650 mg at 05/19/13 1816  . alum & mag hydroxide-simeth (MAALOX/MYLANTA) 200-200-20 MG/5ML suspension 30 mL  30 mL Oral Q4H PRN Shuvon Rankin, NP      . amphetamine-dextroamphetamine (ADDERALL) tablet 20 mg  20 mg Oral BID WC Rachael Fee, MD   20 mg at 05/21/13 1250  . ARIPiprazole (ABILIFY) tablet 2 mg  2 mg Oral Daily Rachael Fee, MD   2 mg at 05/21/13 0753  . ciprofloxacin (CIPRO) tablet 500 mg  500 mg Oral BID Jorje Guild, PA-C   500 mg at 05/21/13 0753  . clonazePAM (KLONOPIN) tablet 1 mg  1 mg Oral TID Rachael Fee, MD   1 mg at 05/21/13 1653  . DULoxetine (CYMBALTA) DR capsule 30 mg  30 mg Oral Daily Rachael Fee, MD   30 mg at 05/21/13 0753  . feeding supplement (ENSURE COMPLETE) (ENSURE COMPLETE) liquid 237 mL  237 mL Oral BID BM Lavena Bullion, RD   237 mL at 05/20/13 1437  . hydrOXYzine (ATARAX/VISTARIL) tablet 25 mg  25 mg Oral Q6H PRN Fransisca Kaufmann, NP      . ibuprofen (ADVIL,MOTRIN) tablet 800 mg  800 mg Oral TID PRN Jorje Guild, PA-C   800 mg at 05/21/13 1653  . magnesium hydroxide (MILK OF MAGNESIA) suspension 30 mL  30 mL Oral Daily PRN Shuvon Rankin, NP      . mirtazapine (REMERON) tablet 15 mg  15 mg Oral QHS Rachael Fee, MD   15 mg at 05/20/13 2152  . nicotine (NICODERM CQ - dosed in mg/24 hours) patch 14 mg  14 mg Transdermal Daily Rachael Fee, MD    14 mg at 05/21/13 0750  . phenazopyridine (PYRIDIUM) tablet 200 mg  200 mg Oral TID WC Verne Spurr, PA-C   200 mg at 05/21/13 1653    Lab Results: No results found for this or any previous visit (from the past 48 hour(s)).  Physical Findings: AIMS: Facial and Oral Movements Muscles of Facial Expression: None, normal Lips and Perioral Area: None, normal Jaw: None, normal Tongue: None, normal,Extremity Movements Upper (arms, wrists, hands, fingers): None, normal Lower (legs, knees, ankles, toes): None, normal, Trunk Movements Neck, shoulders, hips: None, normal, Overall Severity Severity of abnormal movements (highest score from questions above): None, normal Incapacitation due to abnormal movements: None, normal Patient's awareness of abnormal movements (rate only patient's report): No Awareness, Dental Status Current problems with teeth and/or dentures?: No Does patient usually wear dentures?: No  CIWA:  CIWA-Ar Total: 4 COWS:  COWS Total Score: 3  Treatment Plan Summary: Daily contact with patient to assess and evaluate symptoms and progress in treatment Medication management  Plan: Supportive approach/coping skills/relapse prevention            Resume Adderall 20 mg BID  Medical Decision Making Problem Points:  Review of psycho-social stressors (1) Data Points:  Review of medication regiment & side effects (2) Review of new medications or change in dosage (2)  I certify that inpatient services furnished can reasonably be expected to improve the patient's condition.   Miquan Tandon A 05/21/2013, 8:27 PM

## 2013-05-21 NOTE — Tx Team (Signed)
Interdisciplinary Treatment Plan Update (Adult)  Date: 05/21/2013  Time Reviewed:  9:45 AM  Progress in Treatment: Attending groups: Yes Participating in groups:  Yes Taking medication as prescribed:  Yes Tolerating medication:  Yes Family/Significant othe contact made: Yes Patient understands diagnosis:  Yes Discussing patient identified problems/goals with staff:  Yes Medical problems stabilized or resolved:  Yes Denies suicidal/homicidal ideation: Yes Issues/concerns per patient self-inventory:  Yes Other:  New problem(s) identified: N/A  Discharge Plan or Barriers: Pt has follow up scheduled at [facility] for medication management and therapy.    Reason for Continuation of Hospitalization: Anxiety Depression Medication Stabilization  Comments: N/A  Estimated length of stay: 3-5 days  For review of initial/current patient goals, please see plan of care.  Attendees: Patient:     Family:     Physician:  Dr. Dub Mikes 05/21/2013 10:19 AM   Nursing:   Dellia Cloud, RN 05/21/2013 10:19 AM   Clinical Social Worker:  Reyes Ivan, LCSWA 05/21/2013 10:19 AM   Other: Onnie Boer, RN case manager 05/21/2013 10:19 AM   Other:  Trula Slade, LCSWA 05/21/2013 10:19 AM   Other:  Serena Colonel, NP 05/21/2013 10:19 AM   Other:  Tomasita Malina, care coordination 05/21/2013 10:19 AM   Other: Onnie Boer, case manager 05/21/2013 10:19 AM   Other:    Other:    Other:    Other:    Other:     Scribe for Treatment Team:   Carmina Miller, 05/21/2013 , 10:19 AM

## 2013-05-21 NOTE — [facility] Group Notes (Signed)
[facility] LCSW Group Therapy  05/21/2013 4:01 PM  Type of Therapy:  Group Therapy  Participation Level:  Active  Participation Quality:  Appropriate  Affect:  Appropriate  Cognitive:  Alert and Oriented  Insight:  Improving  Engagement in Therapy:  Improving  Modes of Intervention:  Confrontation, Discussion, Education, Exploration, Socialization and Support  Summary of Progress/Problems: Feelings around Relapse. Group members discussed the meaning of relapse and shared personal stories of relapse, how it affected them and others, and how they perceived themselves during this time. Group members were encouraged to identify triggers, warning signs and coping skills used when facing the possibility of relapse. Social supports were discussed and explored in detail. Paula Sosa was attentive and engaged throughout today's therapy group. She demonstrates improving insight and progress in the group setting AEB her ability to process how relapse and mental health are interconnected. "I finally see how I compare myself to others, especially my brother. I feel like I'm not good enough and I don't live up to my family's expectations. This makes me feel misunderstood, not good enough, and worthless and so I use to kill that pain." I don't feel like I deserve to have normalcy. Paula Sosa was able to identify positive traits about herself and acknowledge that she was worthy of happiness. "I plan to live life according to my rules and do what makes me happy."    Smart, Jett Kulzer 05/21/2013, 4:01 PM

## 2013-05-22 DIAGNOSIS — F1910 Other psychoactive substance abuse, uncomplicated: Secondary | ICD-10-CM

## 2013-05-22 MED ORDER — NICOTINE POLACRILEX 2 MG MT GUM
2.0000 mg | CHEWING_GUM | OROMUCOSAL | Status: DC | PRN
  Administered 2013-05-22: 2 mg via ORAL
  Filled 2013-05-22: qty 1

## 2013-05-22 NOTE — Progress Notes (Signed)
[facility] MD Progress Note  05/22/2013 6:06 PM Paula Sosa  MRN:  [id number] Subjective:  Did sleep better last night. States that Paula Sosa is starting to eat better. Overall states Paula Sosa is working on getting her life back together. State Paula Sosa is more hopeful. Paula Sosa recognizes that there is a lot of work Paula Sosa needs to still do but states Paula Sosa is committed. Her mother is supportive. Paula Sosa is going to focus on self and avoid getting involved in any relationships Diagnosis:   DSM5: Schizophrenia Disorders:   Obsessive-Compulsive Disorders:   Trauma-Stressor Disorders:   Substance/Addictive Disorders:   Depressive Disorders:  Major Depressive Disorder - Severe (296.23)  Axis I: Substance Abuse  ADL's:  Intact  Sleep: Fair  Appetite:  Fair  Suicidal Ideation:  Plan:  denies Intent:  denies Means:  denies Homicidal Ideation:  Plan:  denies Intent:  denies Means:  denies AEB (as evidenced by):  Psychiatric Specialty Exam: Review of Systems  Constitutional: Negative.   HENT: Negative.   Eyes: Negative.   Respiratory: Negative.   Cardiovascular: Negative.   Gastrointestinal: Negative.   Genitourinary: Negative.   Musculoskeletal: Negative.   Skin: Negative.   Neurological: Negative.   Endo/Heme/Allergies: Negative.   Psychiatric/Behavioral: Positive for depression and substance abuse. The patient is nervous/anxious.     Blood pressure 125/94, pulse 79, temperature 97.3 F (36.3 C), temperature source Oral, resp. rate 16, height 5' 7.72" (1.72 m), weight 51.71 kg (114 lb), last menstrual period 05/18/2013, SpO2 99.00%.Body mass index is 17.48 kg/(m^2).  General Appearance: Fairly Groomed  Patent attorney::  Fair  Speech:  Clear and Coherent  Volume:  Normal  Mood:  Anxious and worried  Affect:  Appropriate  Thought Process:  Coherent and Goal Directed  Orientation:  Full (Time, Place, and Person)  Thought Content:  worries, concerns as he anticiaptes getting out  Suicidal Thoughts:  No   Homicidal Thoughts:  No  Memory:  Immediate;   Fair Recent;   Fair Remote;   Fair  Judgement:  Fair  Insight:  Present  Psychomotor Activity:  Restlessness  Concentration:  Fair  Recall:  Fair  Akathisia:  No  Handed:    AIMS (if indicated):     Assets:  Desire for Improvement  Sleep:  Number of Hours: 6.75   Current Medications: Current Facility-Administered Medications  Medication Dose Route Frequency Provider Last Rate Last Dose  . acetaminophen (TYLENOL) tablet 650 mg  650 mg Oral Q6H PRN Shuvon Rankin, NP   650 mg at 05/19/13 1816  . alum & mag hydroxide-simeth (MAALOX/MYLANTA) 200-200-20 MG/5ML suspension 30 mL  30 mL Oral Q4H PRN Shuvon Rankin, NP      . amphetamine-dextroamphetamine (ADDERALL) tablet 20 mg  20 mg Oral BID WC Rachael Fee, MD   20 mg at 05/22/13 1123  . ARIPiprazole (ABILIFY) tablet 2 mg  2 mg Oral Daily Rachael Fee, MD   2 mg at 05/22/13 0730  . clonazePAM (KLONOPIN) tablet 1 mg  1 mg Oral TID Rachael Fee, MD   1 mg at 05/22/13 1556  . DULoxetine (CYMBALTA) DR capsule 30 mg  30 mg Oral Daily Rachael Fee, MD   30 mg at 05/22/13 0730  . feeding supplement (ENSURE COMPLETE) (ENSURE COMPLETE) liquid 237 mL  237 mL Oral BID BM Lavena Bullion, RD   237 mL at 05/22/13 1352  . hydrOXYzine (ATARAX/VISTARIL) tablet 25 mg  25 mg Oral Q6H PRN Fransisca Kaufmann, NP   25 mg  at 05/22/13 1351  . ibuprofen (ADVIL,MOTRIN) tablet 800 mg  800 mg Oral TID PRN Jorje Guild, PA-C   800 mg at 05/22/13 1126  . magnesium hydroxide (MILK OF MAGNESIA) suspension 30 mL  30 mL Oral Daily PRN Shuvon Rankin, NP      . mirtazapine (REMERON) tablet 15 mg  15 mg Oral QHS Rachael Fee, MD   15 mg at 05/21/13 2130  . nicotine (NICODERM CQ - dosed in mg/24 hours) patch 14 mg  14 mg Transdermal Daily Rachael Fee, MD   14 mg at 05/22/13 0732  . phenazopyridine (PYRIDIUM) tablet 200 mg  200 mg Oral TID WC Verne Spurr, PA-C   200 mg at 05/22/13 1556    Lab Results: No results found for this or  any previous visit (from the past 48 hour(s)).  Physical Findings: AIMS: Facial and Oral Movements Muscles of Facial Expression: None, normal Lips and Perioral Area: None, normal Jaw: None, normal Tongue: None, normal,Extremity Movements Upper (arms, wrists, hands, fingers): None, normal Lower (legs, knees, ankles, toes): None, normal, Trunk Movements Neck, shoulders, hips: None, normal, Overall Severity Severity of abnormal movements (highest score from questions above): None, normal Incapacitation due to abnormal movements: None, normal Patient's awareness of abnormal movements (rate only patient's report): No Awareness, Dental Status Current problems with teeth and/or dentures?: No Does patient usually wear dentures?: No  CIWA:  CIWA-Ar Total: 0 COWS:  COWS Total Score: 3  Treatment Plan Summary: Daily contact with patient to assess and evaluate symptoms and progress in treatment Medication management  Plan: Supportive approach/coping skills/relapse prevention           Optimize treatment with psychotropics           CBT/challnge the self defeating statements  Medical Decision Making Problem Points:  Review of psycho-social stressors (1) Data Points:  Review of medication regiment & side effects (2)  I certify that inpatient services furnished can reasonably be expected to improve the patient's condition.   Binnie Vonderhaar A 05/22/2013, 6:06 PM

## 2013-05-22 NOTE — Progress Notes (Signed)
Adult Psychoeducational Group Note  Date:  05/22/2013 Time:  3:24 PM  Group Topic/Focus:  Healthy Communication:   The focus of this group is to discuss communication, barriers to communication, as well as healthy ways to communicate with others.  Participation Level:  Active  Participation Quality:  Appropriate and Supportive  Affect:  Appropriate  Cognitive:  Appropriate  Insight: Appropriate  Engagement in Group:  Engaged and Supportive  Modes of Intervention:  Discussion and Education  Additional Comments:  In addition to going over the packet the group watched an educational video series "The Sober Life- My Behavior". Pts where taught how their behavior can directly effect their relationships with others.   Paula Sosa 05/22/2013, 3:24 PM

## 2013-05-22 NOTE — [facility] Group Notes (Signed)
[facility] Group Notes:  (Nursing/MHT/Case Management/Adjunct)  Date:  05/22/2013  Time:  1:34 PM  Type of Therapy:  Psychoeducational Skills  Participation Level:  Active  Participation Quality:  Appropriate  Affect:  Appropriate  Cognitive:  Appropriate  Insight:  Appropriate  Engagement in Group:  Engaged  Modes of Intervention:  Problem-solving  Summary of Progress/Problems: Pt attended healthy coping skills group and self inventory group.  Jacquelyne Balint Shanta 05/22/2013, 1:34 PM

## 2013-05-22 NOTE — [facility] Group Notes (Signed)
[facility] Group Notes:  (Clinical Social Work)  05/22/2013   2:00-2:30PM  Summary of Progress/Problems:   The main focus of today's process group was to explain to the adolescent what "self-sabotage" means and use Motivational Interviewing to discuss what benefits, negative or positive, were involved in a self-identified self-sabotaging behavior.  We then talked about reasons the patient may want to change the behavior and her current desire to change.  A scaling question was used to help patient look at where they are now in motivation for change, from 1 to 10 (lowest to highest motivation).  The patient expressed that she has not used for awhile, but did admit to having Bipolar disorder issues.  She self sabotages by not revealing how she s feeling.  Type of Therapy:  Group Therapy - Process   Participation Level:  Active  Participation Quality:  Attentive  Affect:  Depressed  Cognitive:  Oriented  Insight:  Developing/Improving  Engagement in Therapy:  Engaged  Modes of Intervention:  Support and Processing, Exploration, Discussion  Ambrose Mantle, LCSW 05/22/2013, 12:55 PM

## 2013-05-22 NOTE — Progress Notes (Signed)
D.  Pt pleasant on approach, positive for evening AA group.  Interacting appropriately within milieu.  Pt refused ensure, but stated that she has been eating well and has in fact gained 10 lbs since admission.  States she had herself weighed today by staff.  Pt reported some anxiety and requested vistaril for this.  No other complaints or concerns voiced.  Denies SI/HI/hallucinations at this time.  A.  Support and encouragement offered, medications given as ordered  R.  Pt remains safe on unit, will continue to monitor.

## 2013-05-22 NOTE — ED Provider Notes (Signed)
Medical screening examination/treatment/procedure(s) were conducted as a shared visit with non-physician practitioner(s) and myself.  I personally evaluated the patient during the encounter.  Patient is depressed with suicidal or homicidal ideation. Multiple stressors at home.  Consult behavioral health  Donnetta Hutching, MD 05/22/13 [phone]

## 2013-05-22 NOTE — Progress Notes (Signed)
Patient ID: Paula Sosa, female   DOB: Aug 04, 1981, 32 y.o.   MRN: [id number] 05-22-13 @ 1329 nursing shift note; d: pt has been visible in the milieu, going to groups and taking her med's w/o any adverse affects. A; staff continues to support patient; she has been given ibuprofen prn for a headache and it was effective. RN alerted Recruitment consultant for patient. The pt wanted to discuss some details with social worker about her f/u, after she leaves this facility. RN Immunologist for pt regarding her psychotropic medications. R: RN will monitor and Q 15 min ck's continue.

## 2013-05-23 DIAGNOSIS — F332 Major depressive disorder, recurrent severe without psychotic features: Principal | ICD-10-CM

## 2013-05-23 DIAGNOSIS — F1010 Alcohol abuse, uncomplicated: Secondary | ICD-10-CM

## 2013-05-23 MED ORDER — HYDROXYZINE HCL 25 MG PO TABS: 25.0000 mg | ORAL_TABLET | Freq: Four times a day (QID) | ORAL | Status: DC | PRN

## 2013-05-23 MED ORDER — DULOXETINE HCL 30 MG PO CPEP: 30.0000 mg | ORAL_CAPSULE | Freq: Every day | ORAL | Status: DC

## 2013-05-23 MED ORDER — ARIPIPRAZOLE 2 MG PO TABS: 2.0000 mg | ORAL_TABLET | Freq: Every day | ORAL | Status: DC

## 2013-05-23 MED ORDER — PHENAZOPYRIDINE HCL 200 MG PO TABS: 200.0000 mg | ORAL_TABLET | Freq: Three times a day (TID) | ORAL | Status: DC

## 2013-05-23 MED ORDER — AMPHETAMINE-DEXTROAMPHETAMINE 10 MG PO TABS: 20.0000 mg | ORAL_TABLET | Freq: Two times a day (BID) | ORAL | Status: DC

## 2013-05-23 MED ORDER — CLONAZEPAM 1 MG PO TABS: 1.0000 mg | ORAL_TABLET | Freq: Three times a day (TID) | ORAL | Status: DC

## 2013-05-23 MED ORDER — MIRTAZAPINE 15 MG PO TABS: 15.0000 mg | ORAL_TABLET | Freq: Every day | ORAL | Status: DC

## 2013-05-23 NOTE — Progress Notes (Signed)
[facility] Adult Case Management Discharge Plan :  Will you be returning to the same living situation after discharge: Yes,  as discussed with aftercare planner At discharge, do you have transportation home?:Yes,  being picked up by mother Do you have the ability to pay for your medications:Yes,  no problems noted  Release of information consent forms completed and in the chart;  Patient's signature needed at discharge.  Patient to Follow up at: Follow-up Information   Follow up with [facility] On 06/22/2013. (Appointment scheduled at 3:00 pm with Carolynn Sayers, NP for medication management)    Contact information:   [address] [place], [place] [address] Phone: [phone] Fax: [phone]      Follow up with Areta Haber On 05/27/2013. (Appointment scheduled at 11:00 am for therapy with Kendal Hymen on this date.)    Contact information:   [address] [place], [place] [address] Phone: [phone] Fax: [phone]      Patient denies SI/HI:   Yes,  adamantly    Safety Planning and Suicide Prevention discussed:  Yes,  with social worker during week  Sarina Ser 05/23/2013, 5:01 PM

## 2013-05-23 NOTE — [facility] Group Notes (Signed)
[facility] Group Notes:  (Clinical Social Work)  05/23/2013  10:00-11:00AM  Summary of Progress/Problems:   The main focus of today's process group was to   identify the patient's current support system and decide on other supports that can be put in place.  The picture on workbook was used to discuss why additional supports are needed, and a hand-out was distributed with four definitions/levels of support, then used to talk about how patients have given and received all different kinds of support.  An emphasis was placed on using counselor, doctor, therapy groups, 12-step groups, and problem-specific support groups to expand supports.  The patient identified current supports as her therapist, 3 close friends, her dog and her family.   She was discharged during group.  Type of Therapy:  Process Group with Motivational Interviewing  Participation Level:  Active  Participation Quality:  Attentive, Sharing and Supportive  Affect:  Appropriate  Cognitive:  Appropriate and Oriented  Insight:  Engaged  Engagement in Therapy:  Engaged  Modes of Intervention:   Education, Support and Processing, Activity  [employer], LCSW 05/23/2013, 12:31 PM

## 2013-05-23 NOTE — [facility] Suicide Risk Assessment (Signed)
Suicide Risk Assessment  Discharge Assessment     Demographic Factors:  Caucasian and Living alone  Mental Status Per Nursing Assessment::   On Admission:  Suicidal ideation indicated by patient;Self-harm behaviors  Current Mental Status by Physician: In full contact with reality. Mood is euthymic, affect is appropriate. There are no suicidal ideas, plans or intent. States the medications are working for her. 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 She will create a structure to get herself going every day.   Loss Factors: NA  Historical Factors: NA  Risk Reduction Factors:   Sense of responsibility to family, Positive social support and Positive coping skills or problem solving skills  Continued Clinical Symptoms:  Depression:   Comorbid alcohol abuse/dependence  Cognitive Features That Contribute To Risk:  Polarized thinking Thought constriction (tunnel vision)    Suicide Risk:  Minimal: No identifiable suicidal ideation.  Patients presenting with no risk factors but with morbid ruminations; may be classified as minimal risk based on the severity of the depressive symptoms  Discharge Diagnoses:   AXIS I:  Major Depression recurrent severe AXIS II:  Deferred AXIS III:   Past Medical History  Diagnosis Date  . Depression   . MRSA (methicillin resistant staph aureus) culture positive    AXIS IV:  other psychosocial or environmental problems AXIS V:  61-70 mild symptoms  Plan Of Care/Follow-up recommendations:  Activity:  as tolerated Diet:  regular Follow up Glenford Bayley, Colen Darling Is patient on multiple antipsychotic therapies at discharge:  No   Has Patient had three or more failed trials of antipsychotic monotherapy by history:  No  Recommended Plan for Multiple Antipsychotic  Therapies: NA  Raney Koeppen A 05/23/2013, 10:24 AM

## 2013-05-23 NOTE — Progress Notes (Signed)
The focus of this group is to educate the patient on the purpose and policies of crisis stabilization and provide a format to answer questions about their admission.  The group details unit policies and expectations of patients while admitted.  Patient attended 0900 nurse education orientation group this morning.  Patient actively participated, appropriate affect, alert, appropriate insight and engagement.  Today patient will work on 3 goals for discharge.  

## 2013-05-23 NOTE — Progress Notes (Signed)
Patient ID: Paula Sosa, female   DOB: [birth year], 32 y.o.   MRN: [id number] Discharge Note:  Patient discharged home with family member.  Patient denied SI and HI.  Denied A/V hallucinations.  Denied pain.  Suicide prevention information given and discussed with patient who stated she understood and had no questions.  Patient received all her belongings, clothing, toiletries, miscellaneous items, pocketbook, shades, charger, license, cards, bracelet, keys, belt, caps, prescriptions, medications.  Patient stated she appreciated all assistance received from [facility] staff.

## 2013-05-23 NOTE — Progress Notes (Signed)
Patient did attend the first 40 minutes of the evening speaker AA meeting.  Pt returned to her room for the remainder of group.

## 2013-05-23 NOTE — Discharge Summary (Signed)
Physician Discharge Summary Note  Patient:  Paula Sosa is an 32 y.o., female MRN:  [id number] DOB:  May 12, 1981 Patient phone:  [phone] (home)  Patient address:   [address] [place] [address],   Date of Admission:  05/18/2013 Date of Discharge: 05/23/2013  Reason for Admission:  Depression with suicidal ideations  Discharge Diagnoses: Active Problems:   Severe recurrent major depression  Review of Systems  Constitutional: Negative.   HENT: Negative.   Eyes: Negative.   Respiratory: Negative.   Cardiovascular: Negative.   Gastrointestinal: Negative.   Genitourinary: Negative.   Musculoskeletal: Negative.   Skin: Negative.   Neurological: Negative.   Endo/Heme/Allergies: Negative.   Psychiatric/Behavioral: Positive for substance abuse.    DSM5:  Substance/Addictive Disorders:  Alcohol Related Disorder - Moderate (303.90) Depressive Disorders:  Major Depressive Disorder - Severe (296.23)  Axis Diagnosis:   AXIS I:  Alcohol Abuse, Anxiety Disorder NOS and Major Depression, Recurrent severe AXIS II:  Deferred AXIS III:   Past Medical History  Diagnosis Date  . Depression   . MRSA (methicillin resistant staph aureus) culture positive    AXIS IV:  other psychosocial or environmental problems, problems related to social environment and problems with primary support group AXIS V:  61-70 mild symptoms  Level of Care:  OP  Hospital Course:  On admission:  32 Y/O female who states she feels sad, hoeless, useless. States she drinks three times a month. Smokes pot three times a week. Will be 32 in couple of weeks and her life is "shit." cant keep it together. Thi;nks about death often. She cant sleep, cant eat has lost a lot of weight. She has been able to control her alcohol use since she came out of rehab. She has had numerus medication trials but endorses that they have not been effective. She was on Lexapro but switched to Wellbutrin. She is not sure if  Wellbutrin was all that great in the past. She also has what seems to be premenstrual dysphoria when all her symptoms get worst.   During hospitalization:  Medications managed---Adderall for ADD decreased from 30 mg to 20 mg, Vicodin-Wellbutrin-Lexapro-Vicodin were not continued from her home medication list.  Abilify 2 mg for depression, Cymbalta 30 mg for depression, Vistaril 25 mg every 6 hours PRN anxiety, and Remeron 15 mg for sleep were started.  Pyridium 200 mg daily for bladder discomfort and Klonopin 0.4 TID for anxiety continued.  Camyla attended and participated in therapy.  Patient denies suicidal/homicidal ideations and auditory/visual hallucinations, follow-up appointments encouraged to attend, outside support groups encouraged and information given, Rx given until she can see her outpatient provider.  Paula Sosa is mentally and physically stable for discharge.  Consults:  None  Significant Diagnostic Studies:  labs: completed, reviewed, stable  Discharge Vitals:   Blood pressure 129/86, pulse 76, temperature 97.8 F (36.6 C), temperature source Oral, resp. rate 16, height 5' 7.72" (1.72 m), weight 51.71 kg (114 lb), last menstrual period 05/18/2013, SpO2 99.00%. Body mass index is 17.48 kg/(m^2). Lab Results:   No results found for this or any previous visit (from the past 72 hour(s)).  Physical Findings: AIMS: Facial and Oral Movements Muscles of Facial Expression: None, normal Lips and Perioral Area: None, normal Jaw: None, normal Tongue: None, normal,Extremity Movements Upper (arms, wrists, hands, fingers): None, normal Lower (legs, knees, ankles, toes): None, normal, Trunk Movements Neck, shoulders, hips: None, normal, Overall Severity Severity of abnormal movements (highest score from questions above): None, normal Incapacitation  due to abnormal movements: None, normal Patient's awareness of abnormal movements (rate only patient's report): No Awareness, Dental Status Current  problems with teeth and/or dentures?: No Does patient usually wear dentures?: No  CIWA:  CIWA-Ar Total: 0 COWS:  COWS Total Score: 3  Psychiatric Specialty Exam: See Psychiatric Specialty Exam and Suicide Risk Assessment completed by Attending Physician prior to discharge.  Discharge destination:  Home  Is patient on multiple antipsychotic therapies at discharge:  No   Has Patient had three or more failed trials of antipsychotic monotherapy by history:  No  Recommended Plan for Multiple Antipsychotic Therapies: NA  Discharge Orders   Future Orders Complete By Expires   Activity as tolerated - No restrictions  As directed    Diet - low sodium heart healthy  As directed        Medication List    STOP taking these medications       amphetamine-dextroamphetamine 30 MG tablet  Commonly known as:  ADDERALL  Replaced by:  amphetamine-dextroamphetamine 10 MG tablet     buPROPion 100 MG 12 hr tablet  Commonly known as:  WELLBUTRIN SR     clonazePAM 0.5 MG tablet  Commonly known as:  KLONOPIN     escitalopram 20 MG tablet  Commonly known as:  LEXAPRO     HYDROcodone-acetaminophen 5-325 MG per tablet  Commonly known as:  NORCO/VICODIN      TAKE these medications     Indication   amphetamine-dextroamphetamine 10 MG tablet  Commonly known as:  ADDERALL  Take 2 tablets (20 mg total) by mouth 2 (two) times daily with a meal.   Indication:  Attention Deficit Disorder     ARIPiprazole 2 MG tablet  Commonly known as:  ABILIFY  Take 1 tablet (2 mg total) by mouth daily.   Indication:  Major Depressive Disorder     DULoxetine 30 MG capsule  Commonly known as:  CYMBALTA  Take 1 capsule (30 mg total) by mouth daily.   Indication:  Major Depressive Disorder     hydrOXYzine 25 MG tablet  Commonly known as:  ATARAX/VISTARIL  Take 1 tablet (25 mg total) by mouth every 6 (six) hours as needed for anxiety.      ibuprofen 200 MG tablet  Commonly known as:  ADVIL,MOTRIN  Take 400  mg by mouth every 6 (six) hours as needed for pain. For pain      mirtazapine 15 MG tablet  Commonly known as:  REMERON  Take 1 tablet (15 mg total) by mouth at bedtime.   Indication:  Trouble Sleeping     phenazopyridine 200 MG tablet  Commonly known as:  PYRIDIUM  Take 1 tablet (200 mg total) by mouth 3 (three) times daily with meals.   Indication:  dysuria     VISINE OP  Place 1 drop into both eyes daily as needed (itchy eyes). Itchy eyes            Follow-up Information   Follow up with [facility] On 06/22/2013. (Appointment scheduled at 3:00 pm with Carolynn Sayers, NP for medication management)    Contact information:   [address] [place], [place] [address] Phone: [phone] Fax: [phone]      Follow up with Areta Haber On 05/27/2013. (Appointment scheduled at 11:00 am for therapy with Kendal Hymen on this date.)    Contact information:   [address] [place], [place] [address] Phone: [phone] Fax: [phone]      Follow-up recommendations:  Activity:  as  tolerated Diet:  low-sodium heart healthy diet  Comments:  Patient will continue her at [facility] and [address]. Continue to work your relapse prevention plan Work on the life style changes that could help you better manage your anxiety and mood Total Discharge Time:  Greater than 30 minutes.  Signed: Nanine Means, PMH-NP Agree with assessment and plan Reymundo Poll. Dub Mikes, M.D. 05/23/2013, 9:36 AM

## 2013-05-27 NOTE — Progress Notes (Signed)
Patient Discharge Instructions:  After Visit Summary (AVS):   Faxed to:  05/27/13 Discharge Summary Note:   Faxed to:  05/27/13 Psychiatric Admission Assessment Note:   Faxed to:  05/27/13 Suicide Risk Assessment - Discharge Assessment:   Faxed to:  05/27/13 Faxed/Sent to the Next Level Care provider:  05/27/13 Faxed to [facility] @ [phone] Faxed to Areta Haber @ [phone]  Jerelene Redden, 05/27/2013, 2:34 PM

## 2013-06-09 NOTE — ED Provider Notes (Signed)
Medical screening examination/treatment/procedure(s) were conducted as a shared visit with non-physician practitioner(s) and myself.  I personally evaluated the patient during the encounter.  EKG Interpretation   None      Complains of depression and suicidal ideation. She is presently on Wellbutrin. Will get behavioral health consult   Donnetta Hutching, MD 06/09/13 [phone]

## 2013-10-10 ENCOUNTER — Encounter (HOSPITAL_COMMUNITY): Payer: Self-pay | Admitting: Emergency Medicine

## 2013-10-10 ENCOUNTER — Emergency Department (HOSPITAL_COMMUNITY)
Admission: EM | Admit: 2013-10-10 | Discharge: 2013-10-10 | Disposition: A | Discharge status: Home / Self Care | Payer: BC Managed Care – PPO | Attending: Emergency Medicine | Admitting: Emergency Medicine

## 2013-10-10 DIAGNOSIS — F3289 Other specified depressive episodes: Secondary | ICD-10-CM | POA: Insufficient documentation

## 2013-10-10 DIAGNOSIS — M545 Low back pain, unspecified: Secondary | ICD-10-CM

## 2013-10-10 DIAGNOSIS — F17200 Nicotine dependence, unspecified, uncomplicated: Secondary | ICD-10-CM | POA: Insufficient documentation

## 2013-10-10 DIAGNOSIS — Z79899 Other long term (current) drug therapy: Secondary | ICD-10-CM | POA: Insufficient documentation

## 2013-10-10 DIAGNOSIS — Z880 Allergy status to penicillin: Secondary | ICD-10-CM | POA: Insufficient documentation

## 2013-10-10 DIAGNOSIS — F329 Major depressive disorder, single episode, unspecified: Secondary | ICD-10-CM | POA: Insufficient documentation

## 2013-10-10 MED ORDER — PREDNISONE 20 MG PO TABS
60.0000 mg | ORAL_TABLET | Freq: Once | ORAL | Status: AC
  Administered 2013-10-10: 60 mg via ORAL
  Filled 2013-10-10: qty 3

## 2013-10-10 MED ORDER — ORPHENADRINE CITRATE ER 100 MG PO TB12
100.0000 mg | ORAL_TABLET | Freq: Two times a day (BID) | ORAL | Status: DC
  Administered 2013-10-10: 100 mg via ORAL
  Filled 2013-10-10: qty 1

## 2013-10-10 MED ORDER — HYDROCODONE-ACETAMINOPHEN 5-325 MG PO TABS
1.0000 | ORAL_TABLET | Freq: Once | ORAL | Status: AC
  Administered 2013-10-10: 1 via ORAL
  Filled 2013-10-10: qty 1

## 2013-10-10 MED ORDER — HYDROCODONE-ACETAMINOPHEN 5-325 MG PO TABS: 1.0000 | ORAL_TABLET | Freq: Four times a day (QID) | ORAL | Status: DC | PRN

## 2013-10-10 MED ORDER — PREDNISONE 10 MG PO TABS: 20.0000 mg | ORAL_TABLET | Freq: Every day | ORAL | Status: DC

## 2013-10-10 MED ORDER — ORPHENADRINE CITRATE ER 100 MG PO TB12: 100.0000 mg | ORAL_TABLET | Freq: Two times a day (BID) | ORAL | Status: DC
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
## 2013-10-10 NOTE — ED Provider Notes (Signed)
Medical screening examination/treatment/procedure(s) were performed by non-physician practitioner and as supervising physician I was immediately available for consultation/collaboration.   EKG Interpretation None       Orlie Dakin, MD 10/10/13 [phone]

## 2013-10-18 ENCOUNTER — Encounter (HOSPITAL_BASED_OUTPATIENT_CLINIC_OR_DEPARTMENT_OTHER): Payer: Self-pay | Admitting: Emergency Medicine

## 2013-10-18 ENCOUNTER — Emergency Department (HOSPITAL_BASED_OUTPATIENT_CLINIC_OR_DEPARTMENT_OTHER): Payer: BC Managed Care – PPO

## 2013-10-18 ENCOUNTER — Emergency Department (HOSPITAL_BASED_OUTPATIENT_CLINIC_OR_DEPARTMENT_OTHER)
Admission: EM | Admit: 2013-10-18 | Discharge: 2013-10-18 | Disposition: A | Discharge status: Home / Self Care | Payer: BC Managed Care – PPO | Attending: Emergency Medicine | Admitting: Emergency Medicine

## 2013-10-18 DIAGNOSIS — Z8614 Personal history of Methicillin resistant Staphylococcus aureus infection: Secondary | ICD-10-CM | POA: Insufficient documentation

## 2013-10-18 DIAGNOSIS — Z880 Allergy status to penicillin: Secondary | ICD-10-CM | POA: Insufficient documentation

## 2013-10-18 DIAGNOSIS — R630 Anorexia: Secondary | ICD-10-CM | POA: Insufficient documentation

## 2013-10-18 DIAGNOSIS — A499 Bacterial infection, unspecified: Secondary | ICD-10-CM | POA: Insufficient documentation

## 2013-10-18 DIAGNOSIS — IMO0002 Reserved for concepts with insufficient information to code with codable children: Secondary | ICD-10-CM | POA: Insufficient documentation

## 2013-10-18 DIAGNOSIS — F17200 Nicotine dependence, unspecified, uncomplicated: Secondary | ICD-10-CM | POA: Insufficient documentation

## 2013-10-18 DIAGNOSIS — Z79899 Other long term (current) drug therapy: Secondary | ICD-10-CM | POA: Insufficient documentation

## 2013-10-18 DIAGNOSIS — Z87448 Personal history of other diseases of urinary system: Secondary | ICD-10-CM | POA: Insufficient documentation

## 2013-10-18 DIAGNOSIS — Z8719 Personal history of other diseases of the digestive system: Secondary | ICD-10-CM | POA: Insufficient documentation

## 2013-10-18 DIAGNOSIS — M5430 Sciatica, unspecified side: Secondary | ICD-10-CM | POA: Insufficient documentation

## 2013-10-18 DIAGNOSIS — Z202 Contact with and (suspected) exposure to infections with a predominantly sexual mode of transmission: Secondary | ICD-10-CM | POA: Insufficient documentation

## 2013-10-18 DIAGNOSIS — F319 Bipolar disorder, unspecified: Secondary | ICD-10-CM | POA: Insufficient documentation

## 2013-10-18 DIAGNOSIS — N760 Acute vaginitis: Secondary | ICD-10-CM | POA: Insufficient documentation

## 2013-10-18 DIAGNOSIS — Z3202 Encounter for pregnancy test, result negative: Secondary | ICD-10-CM | POA: Insufficient documentation

## 2013-10-18 DIAGNOSIS — B9689 Other specified bacterial agents as the cause of diseases classified elsewhere: Secondary | ICD-10-CM | POA: Insufficient documentation

## 2013-10-18 LAB — CBC WITH DIFFERENTIAL/PLATELET
BASOS PCT: 0 % (ref 0–1)
Basophils Absolute: 0 10*3/uL (ref 0.0–0.1)
Eosinophils Absolute: 0.1 10*3/uL (ref 0.0–0.7)
Eosinophils Relative: 1 % (ref 0–5)
HEMATOCRIT: 42.2 % (ref 36.0–46.0)
Hemoglobin: 14.6 g/dL (ref 12.0–15.0)
LYMPHS PCT: 22 % (ref 12–46)
Lymphs Abs: 2 10*3/uL (ref 0.7–4.0)
MCH: 31.5 pg (ref 26.0–34.0)
MCHC: 34.6 g/dL (ref 30.0–36.0)
MCV: 90.9 fL (ref 78.0–100.0)
MONO ABS: 0.7 10*3/uL (ref 0.1–1.0)
Monocytes Relative: 7 % (ref 3–12)
NEUTROS PCT: 70 % (ref 43–77)
Neutro Abs: 6.3 10*3/uL (ref 1.7–7.7)
PLATELETS: 237 10*3/uL (ref 150–400)
RBC: 4.64 MIL/uL (ref 3.87–5.11)
RDW: 12.6 % (ref 11.5–15.5)
WBC: 9.1 10*3/uL (ref 4.0–10.5)

## 2013-10-18 LAB — BASIC METABOLIC PANEL
BUN: 19 mg/dL (ref 6–23)
CO2: 31 meq/L (ref 19–32)
CREATININE: 0.8 mg/dL (ref 0.50–1.10)
Calcium: 9.2 mg/dL (ref 8.4–10.5)
Chloride: 100 mEq/L (ref 96–112)
GFR calc Af Amer: >90 mL/min (ref >90)
GFR calc non Af Amer: >90 mL/min (ref >90)
Glucose, Bld: 117 mg/dL — ABNORMAL HIGH (ref 70–99)
POTASSIUM: 3.6 meq/L — AB (ref 3.7–5.3)
SODIUM: 141 meq/L (ref 137–147)

## 2013-10-18 LAB — URINALYSIS, ROUTINE W REFLEX MICROSCOPIC
BILIRUBIN URINE: NEGATIVE
Glucose, UA: 500 mg/dL — AB
Ketones, ur: 15 mg/dL — AB
Leukocytes, UA: NEGATIVE
Nitrite: NEGATIVE
PROTEIN: NEGATIVE mg/dL
Specific Gravity, Urine: 1.014 (ref 1.005–1.030)
Urobilinogen, UA: 0.2 mg/dL (ref 0.0–1.0)
pH: 6 (ref 5.0–8.0)

## 2013-10-18 LAB — WET PREP, GENITAL
TRICH WET PREP: NONE SEEN
WBC, Wet Prep HPF POC: NONE SEEN
YEAST WET PREP: NONE SEEN

## 2013-10-18 LAB — URINE MICROSCOPIC-ADD ON

## 2013-10-18 LAB — PREGNANCY, URINE: Preg Test, Ur: NEGATIVE

## 2013-10-18 MED ORDER — HYDROCODONE-ACETAMINOPHEN 7.5-325 MG PO TABS: 1.0000 | ORAL_TABLET | Freq: Four times a day (QID) | ORAL | Status: DC | PRN

## 2013-10-18 MED ORDER — LIDOCAINE HCL (PF) 1 % IJ SOLN
INTRAMUSCULAR | Status: AC
  Administered 2013-10-18: 5 mL
  Filled 2013-10-18: qty 5

## 2013-10-18 MED ORDER — CYCLOBENZAPRINE HCL 10 MG PO TABS
10.0000 mg | ORAL_TABLET | Freq: Once | ORAL | Status: AC
  Administered 2013-10-18: 10 mg via ORAL
  Filled 2013-10-18: qty 1

## 2013-10-18 MED ORDER — MORPHINE SULFATE 2 MG/ML IJ SOLN
2.0000 mg | Freq: Once | INTRAMUSCULAR | Status: AC
  Administered 2013-10-18: 2 mg via INTRAVENOUS
  Filled 2013-10-18: qty 1

## 2013-10-18 MED ORDER — METRONIDAZOLE 500 MG PO TABS: 500.0000 mg | ORAL_TABLET | Freq: Three times a day (TID) | ORAL | Status: DC

## 2013-10-18 MED ORDER — AZITHROMYCIN 250 MG PO TABS
1000.0000 mg | ORAL_TABLET | Freq: Once | ORAL | Status: AC
  Administered 2013-10-18: 1000 mg via ORAL
  Filled 2013-10-18: qty 4

## 2013-10-18 MED ORDER — SODIUM CHLORIDE 0.9 % IV BOLUS (SEPSIS)
500.0000 mL | Freq: Once | INTRAVENOUS | Status: AC
  Administered 2013-10-18: 500 mL via INTRAVENOUS

## 2013-10-18 MED ORDER — MORPHINE SULFATE 2 MG/ML IJ SOLN
2.0000 mg | Freq: Once | INTRAMUSCULAR | Status: AC
Start: 2013-10-18 — End: 2013-10-18
  Administered 2013-10-18: 2 mg via INTRAVENOUS
  Filled 2013-10-18: qty 1

## 2013-10-18 MED ORDER — CEFTRIAXONE SODIUM 250 MG IJ SOLR
250.0000 mg | Freq: Once | INTRAMUSCULAR | Status: AC
  Administered 2013-10-18: 250 mg via INTRAMUSCULAR
  Filled 2013-10-18: qty 250

## 2013-10-18 MED ORDER — CYCLOBENZAPRINE HCL 10 MG PO TABS: 10.0000 mg | ORAL_TABLET | Freq: Three times a day (TID) | ORAL | Status: DC | PRN

## 2013-10-18 NOTE — ED Notes (Signed)
Pt was requesting pain meds during rounding. Ilona Sorrel RN notified.

## 2013-10-18 NOTE — ED Provider Notes (Signed)
CSN: [id number]     Arrival date & time 10/18/13  1212 History   First MD Initiated Contact with Patient 10/18/13 1402     Chief Complaint  Patient presents with  . Back Pain   HPI Paula Sosa is 33 y.o. female presented to the ED with a complaint of right sided, sharp, pinching back pain that has been occuring for about a week. Patient was seen in [facility] ED on 3/8 and dx with muscle strain. She was sent home with muscle relaxer and pain medications, which she stated worked "a bit." She was also given prednisone but stated she could not take it because it makes her crazy. She doesn't remember any enticing events or trauma. She states she was carrying some heavy objects at work last week. She states the pain does radiate to just below her buttocks on the right side and shoots up her back 1/2 way up. She has had similar pain with kidney infection, during her pregnancy. Patient denies fevers, headache or chills. She has been able to eat today, but has lack of appetite the past 3 days. She denies  vomit, dysuria or frequency. Voiding appropriately. Yesterday she reportedly could not get out of bed due to pain with movement. In addition she was called by her ex-boyfriend 3 weeks and told he has chlamydia and she has concerns for STD. Her LMP ended 4 days ago.Patient denies vaginal irritation, discomfort, discharge or itching.  Her PCP is [facility] on [address].   Past Medical History  Diagnosis Date  . Depression   . MRSA (methicillin resistant staph aureus) culture positive   . Bipolar 1 disorder   . Kidney infection   . Pancreas (digestive gland) works poorly    Past Surgical History  Procedure Laterality Date  . Hand surgery    . Hand surgery    . Tonsillectomy     No family history on file. History  Substance Use Topics  . Smoking status: Current Every Day Smoker -- 0.50 packs/day    Types: Cigarettes  . Smokeless tobacco: Never Used  . Alcohol Use: No   OB History   Grav Para  Term Preterm Abortions TAB SAB Ect Mult Living                 Review of Systems  Constitutional: Positive for activity change, appetite change and fatigue. Negative for fever, chills and diaphoresis.  HENT: Negative for congestion, postnasal drip, rhinorrhea, sinus pressure, sneezing and sore throat.   Eyes: Negative for pain, discharge and itching.  Respiratory: Negative for cough, chest tightness, shortness of breath and wheezing.   Cardiovascular: Negative for chest pain and palpitations.  Gastrointestinal: Positive for nausea. Negative for vomiting, abdominal pain, diarrhea and constipation.  Genitourinary: Negative for dysuria, urgency, hematuria, flank pain, decreased urine volume, vaginal discharge and vaginal pain.  Musculoskeletal: Positive for back pain. Negative for arthralgias and myalgias.  Neurological: Negative for dizziness and headaches.    Allergies  Penicillins  Home Medications   Current Outpatient Rx  Name  Route  Sig  Dispense  Refill  . amphetamine-dextroamphetamine (ADDERALL) 10 MG tablet   Oral   Take 2 tablets (20 mg total) by mouth 2 (two) times daily with a meal.   28 tablet   0   . clonazePAM (KLONOPIN) 1 MG tablet   Oral   Take 1 tablet (1 mg total) by mouth 3 (three) times daily.   42 tablet   0   .  ARIPiprazole (ABILIFY) 2 MG tablet   Oral   Take 1 tablet (2 mg total) by mouth daily.   30 tablet   0   . cyclobenzaprine (FLEXERIL) 10 MG tablet   Oral   Take 1 tablet (10 mg total) by mouth 3 (three) times daily as needed for muscle spasms.   30 tablet   0   . DULoxetine (CYMBALTA) 30 MG capsule   Oral   Take 1 capsule (30 mg total) by mouth daily.   30 capsule   0   . HYDROcodone-acetaminophen (NORCO) 7.5-325 MG per tablet   Oral   Take 1 tablet by mouth every 6 (six) hours as needed for moderate pain.   30 tablet   0   . HYDROcodone-acetaminophen (NORCO/VICODIN) 5-325 MG per tablet   Oral   Take 1 tablet by mouth every 6  (six) hours as needed for moderate pain.   12 tablet   0   . hydrOXYzine (ATARAX/VISTARIL) 25 MG tablet   Oral   Take 1 tablet (25 mg total) by mouth every 6 (six) hours as needed for anxiety.   30 tablet   0   . ibuprofen (ADVIL,MOTRIN) 200 MG tablet   Oral   Take 400 mg by mouth every 6 (six) hours as needed for pain. For pain         . metroNIDAZOLE (FLAGYL) 500 MG tablet   Oral   Take 1 tablet (500 mg total) by mouth 3 (three) times daily.   14 tablet   0   . mirtazapine (REMERON) 15 MG tablet   Oral   Take 1 tablet (15 mg total) by mouth at bedtime.   30 tablet   0   . orphenadrine (NORFLEX) 100 MG tablet   Oral   Take 1 tablet (100 mg total) by mouth 2 (two) times daily.   10 tablet   0   . predniSONE (DELTASONE) 10 MG tablet   Oral   Take 2 tablets (20 mg total) by mouth daily.   10 tablet   0   . Tetrahydrozoline HCl (VISINE OP)   Both Eyes   Place 1 drop into both eyes daily as needed (itchy eyes). Itchy eyes          BP 109/73  Temp(Src) 98.4 F (36.9 C) (Oral)  Resp 20  Ht 5\' 8"  (1.727 m)  Wt 120 lb (54.432 kg)  BMI 18.25 kg/m2  SpO2 100%  LMP 10/11/2013 Physical Exam Gen: Patient in fetal position in bed and appears in pain. Crying. HEENT: AT. [place]. Bilateral TM visualized and normal in appearance. Bilateral eyes without injections or icterus. MMM. Bilateral nares without erythema. Throat without erythema or exudates.  CV: RRR , no murmurs clicks gallops or rubs. Chest: CTAB, no wheeze or crackles Abd: Soft. NTND. BS present. No Masses palpated.  MSK: No CVA tenderness. TTP on right sacral and sciatic region, L5 posterior tender point.  Ext: No erythema. No edema.  Skin: No rashes, purpura or petechiae.  Neuro:  Normal gait. PERLA. EOMi. Alert. Grossly intact.  Psych: Appropriate dress, affect and demeanor.  GYN:  External genitalia within normal limits.  Vaginal mucosa pink, moist, normal rugae.  Nonfriable cervix without lesions, no  discharge or bleeding noted on speculum exam.  Bimanual exam revealed normal, nongravid uterus.  No cervical motion tenderness. No adnexal masses bilaterally.     ED Course  Procedures (including critical care time) Labs Review Labs Reviewed  WET PREP, GENITAL -  Abnormal; Notable for the following:    Clue Cells Wet Prep HPF POC MODERATE (*)    All other components within normal limits  URINALYSIS, ROUTINE W REFLEX MICROSCOPIC - Abnormal; Notable for the following:    Glucose, UA 500 (*)    Hgb urine dipstick TRACE (*)    Ketones, ur 15 (*)    All other components within normal limits  GC/CHLAMYDIA PROBE AMP  PREGNANCY, URINE  URINE MICROSCOPIC-ADD ON  CBC WITH DIFFERENTIAL  BASIC METABOLIC PANEL   Imaging Review Dg Lumbar Spine 2-3 Views  10/18/2013   CLINICAL DATA:  Right back pain  EXAM: LUMBAR SPINE - 2-3 VIEW  COMPARISON:  None.  FINDINGS: Three views of lumbar spine submitted. No acute fracture or subluxation. Alignment, disc spaces and vertebral heights are preserved.  IMPRESSION: Negative.   Electronically Signed   By: Lahoma Crocker M.D.   On: 10/18/2013 15:34     EKG Interpretation None      MDM   Final diagnoses:  Bacterial vaginal infection  Exposure to STD  Sciatica   Patient presented to ED nontoxic in appearance, appeared to be in pain. UA and urine prior were negative. With the exception of 500 of glucose in her urine, trace hemoglobin and trace ketones. CBC and BMP are WNL. Pelvic exam was unremarkable. No signs of adnexal masses or cervical motion tenderness. No CVA tenderness. Wet prep resulted with clue cells. Gonorrhea and Chlamydia cultures were obtained. Considering patient's history treated presumptively for both gonorrhea and Chlamydia with azithromycin and ceftriaxone. Treated BV with flagyl prescription. She was given Flexeril and morphine for pain with improvement. Patient's pain is believed to be caused by sciatic nerve pain. Patient was encouraged to  followup with her PCP within a week for pain f/u and culture results.    Ma Hillock, DO 10/18/13 [place], DO 10/18/13 1609

## 2013-10-18 NOTE — ED Notes (Signed)
Patient states she has had lower back pain for one and a half weeks.  States she was seen at [facility] and treated with prednisone and pain pills.  States the pain is radiating into her buttocks.  Hx of kidney infections.  Also wants to be checked for an std.  Mother of patient states the patient is bipolar and is not taking any of her meds due to losing her insurance,  States she is depressed and is tearful in triage.

## 2013-10-18 NOTE — Discharge Instructions (Signed)
Sciatica Sciatica is pain, weakness, numbness, or tingling along your sciatic nerve. The nerve starts in the lower back and runs down the back of each leg. Nerve damage or certain conditions pinch or put pressure on the sciatic nerve. This causes the pain, weakness, and other discomforts of sciatica. HOME CARE   Only take medicine as told by your doctor.  Apply ice to the affected area for 20 minutes. Do this 3 4 times a day for the first 48 72 hours. Then try heat in the same way.  Exercise, stretch, or do your usual activities if these do not make your pain worse.  Go to physical therapy as told by your doctor.  Keep all doctor visits as told.  Do not wear high heels or shoes that are not supportive.  Get a firm mattress if your mattress is too soft to lessen pain and discomfort. GET HELP RIGHT AWAY IF:   You cannot control when you poop (bowel movement) or pee (urinate).  You have more weakness in your lower back, lower belly (pelvis), butt (buttocks), or legs.  You have redness or puffiness (swelling) of your back.  You have a burning feeling when you pee.  You have pain that gets worse when you lie down.  You have pain that wakes you from your sleep.  Your pain is worse than past pain.  Your pain lasts longer than 4 weeks.  You are suddenly losing weight without reason. MAKE SURE YOU:   Understand these instructions.  Will watch this condition.  Will get help right away if you are not doing well or get worse. Document Released: 04/30/2008 Document Revised: 01/21/2012 Document Reviewed: 12/01/2011 [facility] Patient Information 2014 [place].   Safe Sex Safe sex is about reducing the risk of giving or getting a sexually transmitted disease (STD). STDs are spread through sexual contact involving the genitals, mouth, or rectum. Some STDS can be cured and others cannot. Safe sex can also prevent unintended pregnancies.  SAFE SEX PRACTICES  Limit your sexual  activity to only one partner who is only having sex with you.  Talk to your partner about their past partners, past STDs, and drug use.  Use a condom every time you have sexual intercourse. This includes vaginal, oral, and anal sexual activity. Both females and males should wear condoms during oral sex. Only use latex or polyurethane condoms and water-based lubricants. Petroleum-based lubricants or oils used to lubricate a condom will weaken the condom and increase the chance that it will break. The condom should be in place from the beginning to the end of sexual activity. Wearing a condom reduces, but does not completely eliminate, your risk of getting or giving a STD. STDs can be spread by contact with skin of surrounding areas.  Get vaccinated for hepatitis B and HPV.  Avoid alcohol and recreational drugs which can affect your judgement. You may forget to use a condom or participate in high-risk sex.  For females, avoid douching after sexual intercourse. Douching can spread an infection farther into the reproductive tract.  Check your body for signs of sores, blisters, rashes, or unusual discharge. See your caregiver if you notice any of these signs.  Avoid sexual contact if you have symptoms of an infection or are being treated for an STD. If you or your partner has herpes, avoid sexual contact when blisters are present. Use condoms at all other times.  See your caregiver for regular screenings, examinations, and tests for STDs. Before  having sex with a new partner, each of you should be screened for STDs and talk about the results with your partner. BENEFITS OF SAFE SEX   There is less of a chance of getting or giving an STD.  You can prevent unwanted or unintended pregnancies.  By discussing safer sex concerns with your partner, you may increase feelings of intimacy, comfort, trust, and honesty between the both of you. Document Released: 08/29/2004 Document Revised: 04/15/2012 Document  Reviewed: 01/13/2012 [facility] Patient Information 2014 [place].

## 2013-10-19 LAB — GC/CHLAMYDIA PROBE AMP
CT PROBE, AMP APTIMA: NEGATIVE
GC Probe RNA: NEGATIVE

## 2013-10-19 NOTE — ED Provider Notes (Signed)
I saw and evaluated the patient, reviewed the resident's note and I agree with the findings and plan.   EKG Interpretation None       Leota Jacobsen, MD 10/19/13 [phone]

## 2014-08-03 ENCOUNTER — Encounter (HOSPITAL_COMMUNITY): Payer: Self-pay | Admitting: Emergency Medicine

## 2014-08-03 ENCOUNTER — Emergency Department (HOSPITAL_COMMUNITY)
Admission: EM | Admit: 2014-08-03 | Discharge: 2014-08-04 | Disposition: A | Discharge status: Home / Self Care | Payer: BC Managed Care – PPO | Attending: Emergency Medicine | Admitting: Emergency Medicine

## 2014-08-03 DIAGNOSIS — F319 Bipolar disorder, unspecified: Secondary | ICD-10-CM | POA: Insufficient documentation

## 2014-08-03 DIAGNOSIS — Z720 Tobacco use: Secondary | ICD-10-CM | POA: Insufficient documentation

## 2014-08-03 DIAGNOSIS — O9989 Other specified diseases and conditions complicating pregnancy, childbirth and the puerperium: Secondary | ICD-10-CM | POA: Insufficient documentation

## 2014-08-03 DIAGNOSIS — Z79899 Other long term (current) drug therapy: Secondary | ICD-10-CM | POA: Diagnosis not present

## 2014-08-03 DIAGNOSIS — Z880 Allergy status to penicillin: Secondary | ICD-10-CM | POA: Insufficient documentation

## 2014-08-03 DIAGNOSIS — Z8614 Personal history of Methicillin resistant Staphylococcus aureus infection: Secondary | ICD-10-CM | POA: Diagnosis not present

## 2014-08-03 DIAGNOSIS — R1084 Generalized abdominal pain: Secondary | ICD-10-CM | POA: Insufficient documentation

## 2014-08-03 DIAGNOSIS — Z792 Long term (current) use of antibiotics: Secondary | ICD-10-CM | POA: Insufficient documentation

## 2014-08-03 DIAGNOSIS — Z3A01 Less than 8 weeks gestation of pregnancy: Secondary | ICD-10-CM | POA: Insufficient documentation

## 2014-08-03 DIAGNOSIS — R110 Nausea: Secondary | ICD-10-CM | POA: Diagnosis not present

## 2014-08-03 DIAGNOSIS — O009 Ectopic pregnancy, unspecified: Secondary | ICD-10-CM | POA: Diagnosis not present

## 2014-08-03 NOTE — ED Notes (Addendum)
Pt reports that she has been sen by her Ob/Gyn for trending HCG levels. Pt has known miscarriage, but is reporting increased abdominal pain, has an appointment tomorrow but pt is also concerned she has a UTI with urgency and lower back pain that she has had with previous UTIs. Pt prescribed Vicodin, but this has not relieved pain.

## 2014-08-04 ENCOUNTER — Inpatient Hospital Stay (EMERGENCY_DEPARTMENT_HOSPITAL)
Admission: AD | Admit: 2014-08-04 | Discharge: 2014-08-04 | Disposition: A | Discharge status: Home / Self Care | Payer: BC Managed Care – PPO | Source: Ambulatory Visit | Attending: Obstetrics and Gynecology | Admitting: Obstetrics and Gynecology

## 2014-08-04 ENCOUNTER — Encounter (HOSPITAL_COMMUNITY): Payer: Self-pay | Admitting: *Deleted

## 2014-08-04 DIAGNOSIS — F17210 Nicotine dependence, cigarettes, uncomplicated: Secondary | ICD-10-CM | POA: Insufficient documentation

## 2014-08-04 DIAGNOSIS — O009 Unspecified ectopic pregnancy without intrauterine pregnancy: Secondary | ICD-10-CM

## 2014-08-04 DIAGNOSIS — Z87442 Personal history of urinary calculi: Secondary | ICD-10-CM

## 2014-08-04 DIAGNOSIS — O001 Tubal pregnancy: Secondary | ICD-10-CM

## 2014-08-04 LAB — TYPE AND SCREEN
ABO/RH(D): A POS
Antibody Screen: NEGATIVE

## 2014-08-04 LAB — CREATININE, SERUM
Creatinine, Ser: 0.56 mg/dL (ref 0.50–1.10)
GFR calc Af Amer: >90 mL/min (ref >90)
GFR calc non Af Amer: >90 mL/min (ref >90)

## 2014-08-04 LAB — CBC WITH DIFFERENTIAL/PLATELET
BASOS ABS: 0 10*3/uL (ref 0.0–0.1)
Basophils Relative: 1 % (ref 0–1)
EOS ABS: 0.2 10*3/uL (ref 0.0–0.7)
EOS PCT: 3 % (ref 0–5)
HCT: 36.5 % (ref 36.0–46.0)
Hemoglobin: 12.4 g/dL (ref 12.0–15.0)
Lymphocytes Relative: 30 % (ref 12–46)
Lymphs Abs: 2 10*3/uL (ref 0.7–4.0)
MCH: 31 pg (ref 26.0–34.0)
MCHC: 34 g/dL (ref 30.0–36.0)
MCV: 91.3 fL (ref 78.0–100.0)
Monocytes Absolute: 0.4 10*3/uL (ref 0.1–1.0)
Monocytes Relative: 6 % (ref 3–12)
NEUTROS PCT: 60 % (ref 43–77)
Neutro Abs: 4.1 10*3/uL (ref 1.7–7.7)
PLATELETS: 208 10*3/uL (ref 150–400)
RBC: 4 MIL/uL (ref 3.87–5.11)
RDW: 14.2 % (ref 11.5–15.5)
WBC: 6.7 10*3/uL (ref 4.0–10.5)

## 2014-08-04 LAB — I-STAT CHEM 8, ED
BUN: 24 mg/dL — ABNORMAL HIGH (ref 6–23)
Calcium, Ion: 1.2 mmol/L (ref 1.12–1.23)
Chloride: 104 mEq/L (ref 96–112)
Creatinine, Ser: 0.8 mg/dL (ref 0.50–1.10)
GLUCOSE: 99 mg/dL (ref 70–99)
HEMATOCRIT: 35 % — AB (ref 36.0–46.0)
HEMOGLOBIN: 11.9 g/dL — AB (ref 12.0–15.0)
POTASSIUM: 3.9 mmol/L (ref 3.5–5.1)
SODIUM: 141 mmol/L (ref 135–145)
TCO2: 24 mmol/L (ref 0–100)

## 2014-08-04 LAB — URINALYSIS, ROUTINE W REFLEX MICROSCOPIC
Bilirubin Urine: NEGATIVE
GLUCOSE, UA: NEGATIVE mg/dL
Hgb urine dipstick: NEGATIVE
KETONES UR: NEGATIVE mg/dL
LEUKOCYTES UA: NEGATIVE
NITRITE: NEGATIVE
PH: 5.5 (ref 5.0–8.0)
Protein, ur: NEGATIVE mg/dL
SPECIFIC GRAVITY, URINE: 1.026 (ref 1.005–1.030)
Urobilinogen, UA: 0.2 mg/dL (ref 0.0–1.0)

## 2014-08-04 LAB — BUN: BUN: 21 mg/dL (ref 6–23)

## 2014-08-04 LAB — I-STAT BETA HCG BLOOD, ED (MC, WL, AP ONLY): HCG, QUANTITATIVE: 56.4 m[IU]/mL — AB (ref <5)

## 2014-08-04 LAB — AST: AST: 34 U/L (ref 0–37)

## 2014-08-04 LAB — HCG, QUANTITATIVE, PREGNANCY: hCG, Beta Chain, Quant, S: 59 m[IU]/mL — ABNORMAL HIGH (ref <5)

## 2014-08-04 LAB — ABO/RH: ABO/RH(D): A POS

## 2014-08-04 MED ORDER — KETOROLAC TROMETHAMINE 30 MG/ML IJ SOLN
30.0000 mg | Freq: Once | INTRAMUSCULAR | Status: DC
  Filled 2014-08-04: qty 1

## 2014-08-04 MED ORDER — HYDROMORPHONE HCL 1 MG/ML IJ SOLN
1.0000 mg | Freq: Once | INTRAMUSCULAR | Status: AC
  Administered 2014-08-04: 1 mg via INTRAVENOUS

## 2014-08-04 MED ORDER — HYDROMORPHONE HCL 1 MG/ML IJ SOLN
1.0000 mg | Freq: Once | INTRAMUSCULAR | Status: DC
  Filled 2014-08-04: qty 1

## 2014-08-04 MED ORDER — METHOTREXATE INJECTION FOR WOMEN'S HOSPITAL
50.0000 mg/m2 | Freq: Once | INTRAMUSCULAR | Status: AC
  Administered 2014-08-04: 90 mg via INTRAMUSCULAR
  Filled 2014-08-04: qty 1.8

## 2014-08-04 MED ORDER — KETOROLAC TROMETHAMINE 30 MG/ML IJ SOLN
30.0000 mg | Freq: Once | INTRAMUSCULAR | Status: AC
  Administered 2014-08-04: 30 mg via INTRAVENOUS

## 2014-08-04 MED ORDER — OXYCODONE-ACETAMINOPHEN 5-325 MG PO TABS: 1.0000 | ORAL_TABLET | Freq: Once | ORAL | Status: DC

## 2014-08-04 MED ORDER — OXYCODONE-ACETAMINOPHEN 5-325 MG PO TABS
1.0000 | ORAL_TABLET | Freq: Once | ORAL | Status: AC
  Administered 2014-08-04: 1 via ORAL
  Filled 2014-08-04: qty 1

## 2014-08-04 MED ORDER — ONDANSETRON 4 MG PO TBDP: 4.0000 mg | ORAL_TABLET | Freq: Three times a day (TID) | ORAL | Status: DC | PRN

## 2014-08-04 NOTE — MAU Note (Signed)
Sent from office for methotrexate injection, has had blood work and [place] at office.   Reports back pain, and pain in lower abd.  Has been bleeding daily for over a month.

## 2014-08-04 NOTE — MAU Provider Note (Signed)
History     CSN: [id number]  Arrival date and time: 08/04/14 1336   None     Chief Complaint  Patient presents with  . Ectopic Pregnancy   HPI  Paula Sosa is a 33 y.o. G2P0010 who was sent from the office for MTX therapy. She has had plateau ed HCG levels and abdominal pain. [place] in the office today showed a 2.1 cm hyperechoic area in the left adnexa.   Past Medical History  Diagnosis Date  . Depression   . MRSA (methicillin resistant staph aureus) culture positive 1994  . Bipolar 1 disorder   . Kidney infection   . Pancreas (digestive gland) works poorly   . Anemia   . Kidney stones     Past Surgical History  Procedure Laterality Date  . Hand surgery    . Hand surgery    . Tonsillectomy    . Therapeutic abortion      medically indicated  . Back surgery      Family History  Problem Relation Age of Onset  . Anxiety disorder Mother   . COPD Mother   . Hypertension Father   . Anxiety disorder Father   . COPD Father   . Cancer Maternal Grandmother     ovarian    History  Substance Use Topics  . Smoking status: Current Every Day Smoker -- 0.25 packs/day for 23 years    Types: Cigarettes  . Smokeless tobacco: Never Used  . Alcohol Use: 0.6 oz/week    1 Glasses of wine per week     Comment: not wkly    Allergies:  Allergies  Allergen Reactions  . Prednisone Other (See Comments)    delusions.  . Penicillins Rash    Prescriptions prior to admission  Medication Sig Dispense Refill Last Dose  . amphetamine-dextroamphetamine (ADDERALL) 10 MG tablet Take 2 tablets (20 mg total) by mouth 2 (two) times daily with a meal. (Patient not taking: Reported on 08/04/2014) 28 tablet 0   . ARIPiprazole (ABILIFY) 2 MG tablet Take 1 tablet (2 mg total) by mouth daily. (Patient not taking: Reported on 08/04/2014) 30 tablet 0   . clonazePAM (KLONOPIN) 1 MG tablet Take 1 tablet (1 mg total) by mouth 3 (three) times daily. 42 tablet 0   . clonazePAM (KLONOPIN) 1 MG  tablet Take 1 mg by mouth 2 (two) times daily as needed for anxiety.   08/03/2014 at Unknown time  . cyclobenzaprine (FLEXERIL) 10 MG tablet Take 1 tablet (10 mg total) by mouth 3 (three) times daily as needed for muscle spasms. (Patient not taking: Reported on 08/04/2014) 30 tablet 0   . DULoxetine (CYMBALTA) 30 MG capsule Take 1 capsule (30 mg total) by mouth daily. (Patient not taking: Reported on 08/04/2014) 30 capsule 0   . escitalopram (LEXAPRO) 10 MG tablet Take 5 mg by mouth daily. 25 mg total   08/03/2014 at Unknown time  . escitalopram (LEXAPRO) 20 MG tablet Take 20 mg by mouth daily. 25 mg total   08/03/2014 at Unknown time  . gabapentin (NEURONTIN) 300 MG capsule Take 300 mg by mouth 2 (two) times daily.   08/03/2014 at Unknown time  . HYDROcodone-acetaminophen (NORCO) 7.5-325 MG per tablet Take 1 tablet by mouth every 6 (six) hours as needed for moderate pain. (Patient not taking: Reported on 08/04/2014) 30 tablet 0   . HYDROcodone-acetaminophen (NORCO/VICODIN) 5-325 MG per tablet Take 1 tablet by mouth every 6 (six) hours as needed for moderate pain. (Patient  not taking: Reported on 08/04/2014) 12 tablet 0   . hydrOXYzine (ATARAX/VISTARIL) 25 MG tablet Take 1 tablet (25 mg total) by mouth every 6 (six) hours as needed for anxiety. (Patient not taking: Reported on 08/04/2014) 30 tablet 0   . ibuprofen (ADVIL,MOTRIN) 200 MG tablet Take 400 mg by mouth every 6 (six) hours as needed for pain. For pain   08/03/2014 at Unknown time  . lisdexamfetamine (VYVANSE) 60 MG capsule Take 60 mg by mouth every morning.   08/03/2014 at Unknown time  . metroNIDAZOLE (FLAGYL) 500 MG tablet Take 1 tablet (500 mg total) by mouth 3 (three) times daily. (Patient not taking: Reported on 08/04/2014) 14 tablet 0   . mirtazapine (REMERON) 15 MG tablet Take 1 tablet (15 mg total) by mouth at bedtime. (Patient not taking: Reported on 08/04/2014) 30 tablet 0   . orphenadrine (NORFLEX) 100 MG tablet Take 1 tablet (100  mg total) by mouth 2 (two) times daily. (Patient not taking: Reported on 08/04/2014) 10 tablet 0   . predniSONE (DELTASONE) 10 MG tablet Take 2 tablets (20 mg total) by mouth daily. 10 tablet 0   . Tetrahydrozoline HCl (VISINE OP) Place 1 drop into both eyes daily as needed (itchy eyes). Itchy eyes   08/03/2014 at Unknown time    ROS Physical Exam   Blood pressure 115/71, pulse 82, temperature 98.4 F (36.9 C), temperature source Oral, resp. rate 16, height 5\' 7"  (1.702 m), weight 64.864 kg (143 lb).  Physical Exam  Nursing note and vitals reviewed. Constitutional: She is oriented to person, place, and time. She appears well-developed and well-nourished.  Cardiovascular: Normal rate.   Respiratory: Effort normal.  GI: Soft. There is no tenderness. There is no rebound.  Neurological: She is alert and oriented to person, place, and time.  Skin: Skin is warm and dry.  Psychiatric: She has a normal mood and affect.    MAU Course  Procedures  Results for orders placed or performed during the hospital encounter of 08/04/14 (from the past 24 hour(s))  hCG, quantitative, pregnancy     Status: Abnormal   Collection Time: 08/04/14  1:44 PM  Result Value Ref Range   hCG, Beta Chain, Quant, S 59 (H) <5 mIU/mL  CBC WITH DIFFERENTIAL     Status: None   Collection Time: 08/04/14  1:44 PM  Result Value Ref Range   WBC 6.7 4.0 - 10.5 K/uL   RBC 4.00 3.87 - 5.11 MIL/uL   Hemoglobin 12.4 12.0 - 15.0 g/dL   HCT 36.5 36.0 - 46.0 %   MCV 91.3 78.0 - 100.0 fL   MCH 31.0 26.0 - 34.0 pg   MCHC 34.0 30.0 - 36.0 g/dL   RDW 14.2 11.5 - 15.5 %   Platelets 208 150 - 400 K/uL   Neutrophils Relative % 60 43 - 77 %   Neutro Abs 4.1 1.7 - 7.7 K/uL   Lymphocytes Relative 30 12 - 46 %   Lymphs Abs 2.0 0.7 - 4.0 K/uL   Monocytes Relative 6 3 - 12 %   Monocytes Absolute 0.4 0.1 - 1.0 K/uL   Eosinophils Relative 3 0 - 5 %   Eosinophils Absolute 0.2 0.0 - 0.7 K/uL   Basophils Relative 1 0 - 1 %    Basophils Absolute 0.0 0.0 - 0.1 K/uL  AST     Status: None   Collection Time: 08/04/14  1:44 PM  Result Value Ref Range   AST 34 0 - 37  U/L  BUN     Status: None   Collection Time: 08/04/14  1:44 PM  Result Value Ref Range   BUN 21 6 - 23 mg/dL  Creatinine, serum     Status: None   Collection Time: 08/04/14  1:44 PM  Result Value Ref Range   Creatinine, Ser 0.56 0.50 - 1.10 mg/dL   GFR calc non Af Amer >90 >90 mL/min   GFR calc Af Amer >90 >90 mL/min  Type and screen     Status: None   Collection Time: 08/04/14  1:44 PM  Result Value Ref Range   ABO/RH(D) A POS    Antibody Screen NEG    Sample Expiration 08/07/2014     Patient requesting a medication for nausea. RX for zofran PRN #[id number]  Assessment and Plan   1. Ectopic pregnancy    Ectopic precautions  Return to MAU as needed  Follow-up Information    Follow up with [facility].   Why:  Sunday 08/07/14 for repeat bloodwork    Contact information:   [address] [phone] [place] [phone]       Mathis Bud 08/04/2014, 3:56 PM

## 2014-08-04 NOTE — ED Provider Notes (Signed)
CSN: [id number]     Arrival date & time 08/03/14  2253 History   First MD Initiated Contact with Patient 08/04/14 0100     Chief Complaint  Patient presents with  . Abdominal Pain     HPI  Patient presents with concern of ongoing crampy lower abdominal pain. Pain is crampy, intermittent, associated with ongoing intermittent vaginal bleeding. Patient has recently diagnosed spontaneous miscarriage, this is her second pregnancy. Patient has seen her obstetrician within the past days, had pelvic exam, ultrasound within the past days. She is scheduled for repeat evaluation tomorrow, serial hCG check tomorrow. Today, she denies other abdominal pain, lightheadedness, syncope, chest pain, dyspnea or other focal changes from her baseline condition. No relief of her abdominal pain with ibuprofen.   Past Medical History  Diagnosis Date  . Depression   . MRSA (methicillin resistant staph aureus) culture positive   . Bipolar 1 disorder   . Kidney infection   . Pancreas (digestive gland) works poorly    Past Surgical History  Procedure Laterality Date  . Hand surgery    . Hand surgery    . Tonsillectomy     History reviewed. No pertinent family history. History  Substance Use Topics  . Smoking status: Current Every Day Smoker -- 0.50 packs/day    Types: Cigarettes  . Smokeless tobacco: Never Used  . Alcohol Use: No   OB History    No data available     Review of Systems  Constitutional:       Per HPI, otherwise negative  HENT:       Per HPI, otherwise negative  Respiratory:       Per HPI, otherwise negative  Cardiovascular:       Per HPI, otherwise negative  Gastrointestinal: Positive for nausea. Negative for vomiting.  Endocrine:       Negative aside from HPI  Genitourinary:       Neg aside from HPI   Musculoskeletal:       Per HPI, otherwise negative  Skin: Negative.  Negative for pallor.  Neurological: Negative for syncope.      Allergies  Penicillins  Home  Medications   Prior to Admission medications   Medication Sig Start Date End Date Taking? Authorizing Provider  amphetamine-dextroamphetamine (ADDERALL) 10 MG tablet Take 2 tablets (20 mg total) by mouth 2 (two) times daily with a meal. 05/23/13   Waylan Boga, NP  ARIPiprazole (ABILIFY) 2 MG tablet Take 1 tablet (2 mg total) by mouth daily. 05/23/13   Waylan Boga, NP  clonazePAM (KLONOPIN) 1 MG tablet Take 1 tablet (1 mg total) by mouth 3 (three) times daily. 05/23/13   Waylan Boga, NP  cyclobenzaprine (FLEXERIL) 10 MG tablet Take 1 tablet (10 mg total) by mouth 3 (three) times daily as needed for muscle spasms. 10/18/13   Renee A Kuneff, DO  DULoxetine (CYMBALTA) 30 MG capsule Take 1 capsule (30 mg total) by mouth daily. 05/23/13   Waylan Boga, NP  HYDROcodone-acetaminophen (NORCO) 7.5-325 MG per tablet Take 1 tablet by mouth every 6 (six) hours as needed for moderate pain. 10/18/13   Renee A Kuneff, DO  HYDROcodone-acetaminophen (NORCO/VICODIN) 5-325 MG per tablet Take 1 tablet by mouth every 6 (six) hours as needed for moderate pain. 10/10/13   Garald Balding, NP  hydrOXYzine (ATARAX/VISTARIL) 25 MG tablet Take 1 tablet (25 mg total) by mouth every 6 (six) hours as needed for anxiety. 05/23/13   Waylan Boga, NP  ibuprofen (ADVIL,MOTRIN) 200 MG  tablet Take 400 mg by mouth every 6 (six) hours as needed for pain. For pain    Historical Provider, MD  metroNIDAZOLE (FLAGYL) 500 MG tablet Take 1 tablet (500 mg total) by mouth 3 (three) times daily. 10/18/13   Renee A Kuneff, DO  mirtazapine (REMERON) 15 MG tablet Take 1 tablet (15 mg total) by mouth at bedtime. 05/23/13   Waylan Boga, NP  orphenadrine (NORFLEX) 100 MG tablet Take 1 tablet (100 mg total) by mouth 2 (two) times daily. 10/10/13   Garald Balding, NP  predniSONE (DELTASONE) 10 MG tablet Take 2 tablets (20 mg total) by mouth daily. 10/10/13   Garald Balding, NP  Tetrahydrozoline HCl (VISINE OP) Place 1 drop into both eyes daily as needed (itchy  eyes). Itchy eyes    Historical Provider, MD   BP 101/67 mmHg  Pulse 84  Temp(Src) 98.3 F (36.8 C) (Oral)  Resp 16  Ht 5\' 8"  (1.727 m)  Wt 142 lb (64.411 kg)  BMI 21.60 kg/m2  SpO2 99%  LMP 07/24/2014 Physical Exam  Constitutional: She is oriented to person, place, and time. She appears well-developed and well-nourished. No distress.  HENT:  Head: Normocephalic and atraumatic.  Eyes: Conjunctivae and EOM are normal.  Cardiovascular: Normal rate and regular rhythm.   Pulmonary/Chest: Effort normal and breath sounds normal. No stridor. No respiratory distress.  Abdominal: She exhibits no distension.  Soft, non-peritoneal abdomen.  Musculoskeletal: She exhibits no edema.  Neurological: She is alert and oriented to person, place, and time. No cranial nerve deficit.  Skin: Skin is warm and dry.  Psychiatric: She has a normal mood and affect.  Nursing note and vitals reviewed.   ED Course  Procedures (including critical care time) Labs Review Labs Reviewed  I-STAT BETA HCG BLOOD, ED ([facility], [facility], [facility] ONLY) - Abnormal; Notable for the following:    I-stat hCG, quantitative 56.4 (*)    All other components within normal limits  I-STAT CHEM 8, ED - Abnormal; Notable for the following:    BUN 24 (*)    Hemoglobin 11.9 (*)    HCT 35.0 (*)    All other components within normal limits  URINALYSIS, ROUTINE W REFLEX MICROSCOPIC   I reviewed the patient's EMR, though there are no records available here, and no prior hCG level to compare results with.   3:32 AM Patient substantially better. I reviewed all findings with her, including slight drop in hemoglobin, very low hCG level. With almost entire resolution of pain, and no active bleeding, patient will follow up with gynecology tomorrow.  MDM  Patient with ongoing miscarriage presents with crampy abdominal pain, no evidence for peritonitis, no reproducible pain, stable vital signs. Patient's pain was well controlled here. With very  recent ultrasound, and with OB follow-up in less than 12 hours, once the patient's pain resolved, she was appropriate for discharge with prompt follow-up, as scheduled.    Carmin Muskrat, MD 08/04/14 [phone]

## 2014-08-04 NOTE — Discharge Instructions (Signed)
As discussed, it is important that you follow up with your physician tomorrow for continued management of your condition.  If you develop any new, or concerning changes in your condition, please return to the emergency department immediately.

## 2014-08-04 NOTE — ED Notes (Signed)
Bed: QH22 Expected date:  Expected time:  Means of arrival:  Comments: Nix alert!

## 2014-08-04 NOTE — MAU Note (Signed)
Pt states she had left to pick up prescription, does not need pain meds

## 2014-08-04 NOTE — MAU Note (Signed)
Called pt from  Lobby for meds (per her request) and provider to talk with her...... Not in lobby

## 2014-08-04 NOTE — Discharge Instructions (Signed)
Ectopic Pregnancy An ectopic pregnancy happens when a fertilized egg grows outside the uterus. A pregnancy cannot live outside of the uterus. This problem often happens in the fallopian tube. It is often caused by damage to the fallopian tube. If this problem is found early, you may be treated with medicine. If your tube tears or bursts open (ruptures), you will bleed inside. This is an emergency. You will need surgery. Get help right away.  SYMPTOMS You may have normal pregnancy symptoms at first. These include:  Missing your period.  Feeling sick to your stomach (nauseous).  Being tired.  Having tender breasts. Then, you may start to have symptoms that are not normal. These include:  Pain with sex (intercourse).  Bleeding from the vagina. This includes light bleeding (spotting).  Belly (abdomen) or lower belly cramping or pain. This may be felt on one side.  A fast heartbeat (pulse).  Passing out (fainting) after going poop (bowel movement). If your tube tears, you may have symptoms such as:  Really bad pain in the belly or lower belly. This happens suddenly.  Dizziness.  Passing out.  Shoulder pain. GET HELP RIGHT AWAY IF:  You have any of these symptoms. This is an emergency. MAKE SURE YOU:  Understand these instructions.  Will watch your condition.  Will get help right away if you are not doing well or get worse. Document Released: 10/18/2008 Document Revised: 07/27/2013 Document Reviewed: 03/03/2013 [facility] Patient Information 2015 [place], [place]. This information is not intended to replace advice given to you by your health care provider. Make sure you discuss any questions you have with your health care provider.  Methotrexate Treatment for an Ectopic Pregnancy, Care After Refer to this sheet in the next few weeks. These instructions provide you with information on caring for yourself after your procedure. Your health care provider may also give you more  specific instructions. Your treatment has been planned according to current medical practices, but problems sometimes occur. Call your health care provider if you have any problems or questions after your procedure. WHAT TO EXPECT AFTER THE PROCEDURE You may have some abdominal cramping, vaginal bleeding, and fatigue in the first few days after taking methotrexate. Some other possible side effects of methotrexate include:  Nausea.  Vomiting.  Diarrhea.  Mouth sores.  Swelling or irritation of the lining of your lungs (pneumonitis).  Liver damage.  Hair loss. HOME CARE INSTRUCTIONS  After you have received the methotrexate medicine, you need to be careful of your activities and watch your condition for several weeks. It may take 1 week before your hormone levels return to normal.  Keep all follow-up appointments as directed by your health care provider.  Avoid traveling too far away from your health care provider.  Do not have sexual intercourse until your health care provider says it is safe to do so.  You may resume your usual diet.  Limit strenuous activity.  Do not take folic acid, prenatal vitamins, or other vitamins that contain folic acid.  Do not take aspirin, ibuprofen, or naproxen (nonsteroidal anti-inflammatory drugs [NSAIDs]).  Do not drink alcohol. SEEK MEDICAL CARE IF:   You cannot control your nausea and vomiting.  You cannot control your diarrhea.  You have sores in your mouth and want treatment.  You need pain medicine for your abdominal pain.  You have a rash.  You are having a reaction to the medicine. SEEK IMMEDIATE MEDICAL CARE IF:   You have increasing abdominal or pelvic pain.  You notice increased bleeding.  You feel light-headed, or you faint.  You have shortness of breath.  Your heart rate increases.  You have a cough.  You have chills.  You have a fever. Document Released: 07/11/2011 Document Revised: 07/27/2013 Document  Reviewed: 05/10/2013 [facility] Patient Information 2015 [place], [place]. This information is not intended to replace advice given to you by your health care provider. Make sure you discuss any questions you have with your health care provider.

## 2014-08-04 NOTE — MAU Note (Signed)
Ectopic and methotrexate hand out given. Pt to lobby to wait for labs. Process explained.

## 2014-08-07 ENCOUNTER — Telehealth (HOSPITAL_COMMUNITY): Payer: Self-pay | Admitting: *Deleted

## 2014-08-07 ENCOUNTER — Inpatient Hospital Stay (HOSPITAL_COMMUNITY)
Admission: AD | Admit: 2014-08-07 | Discharge: 2014-08-07 | Disposition: A | Discharge status: Home / Self Care | Payer: 59 | Source: Ambulatory Visit | Attending: Obstetrics and Gynecology | Admitting: Obstetrics and Gynecology

## 2014-08-07 ENCOUNTER — Encounter (HOSPITAL_COMMUNITY): Payer: Self-pay

## 2014-08-07 DIAGNOSIS — M545 Low back pain, unspecified: Secondary | ICD-10-CM

## 2014-08-07 DIAGNOSIS — O001 Tubal pregnancy: Secondary | ICD-10-CM | POA: Insufficient documentation

## 2014-08-07 DIAGNOSIS — F17210 Nicotine dependence, cigarettes, uncomplicated: Secondary | ICD-10-CM | POA: Insufficient documentation

## 2014-08-07 DIAGNOSIS — O009 Unspecified ectopic pregnancy without intrauterine pregnancy: Secondary | ICD-10-CM

## 2014-08-07 DIAGNOSIS — M542 Cervicalgia: Secondary | ICD-10-CM | POA: Diagnosis present

## 2014-08-07 DIAGNOSIS — Z87442 Personal history of urinary calculi: Secondary | ICD-10-CM | POA: Diagnosis not present

## 2014-08-07 LAB — HCG, QUANTITATIVE, PREGNANCY: hCG, Beta Chain, Quant, S: 51 m[IU]/mL — ABNORMAL HIGH (ref <5)

## 2014-08-07 MED ORDER — HYDROCODONE-ACETAMINOPHEN 5-325 MG PO TABS: 1.0000 | ORAL_TABLET | Freq: Four times a day (QID) | ORAL | Status: DC | PRN

## 2014-08-07 MED ORDER — PROMETHAZINE HCL 25 MG PO TABS: 25.0000 mg | ORAL_TABLET | Freq: Four times a day (QID) | ORAL | Status: DC | PRN

## 2014-08-07 MED ORDER — PROMETHAZINE HCL 25 MG PO TABS
25.0000 mg | ORAL_TABLET | Freq: Four times a day (QID) | ORAL | Status: DC | PRN
  Administered 2014-08-07: 25 mg via ORAL
  Filled 2014-08-07: qty 1

## 2014-08-07 NOTE — MAU Note (Signed)
Heat packs given/applied

## 2014-08-07 NOTE — MAU Provider Note (Signed)
History     CSN: [id number]  Arrival date and time: 08/07/14 1227   First Provider Initiated Contact with Patient 08/07/14 1428      Chief Complaint  Patient presents with  . Shoulder Pain  . repeat labs   . Nausea   HPI Paula Sosa 34 y.o. G2P0010 @unknown  GA presents to MAU after MTX on 12/31.  Since that time she has had an increase in neck and back pain.  Its all across her shoulders 8/10 and low back pain is bilateral, 6/10.  It is hurting so much she cannot move her neck and head.  She also notes increased nausea but no vomiting.  She is not eating much at all and can't seem to make herself eat.  She is not using Vyvanse in an attempt to improve appetite.  Zofran is not helpful.   Pain medication last given gives incomplete relief and ibuprofen also not helpful.   OB History    Gravida Para Term Preterm AB TAB SAB Ectopic Multiple Living   2 0 0 0 1 1 0 0 0 0       Past Medical History  Diagnosis Date  . Depression   . MRSA (methicillin resistant staph aureus) culture positive 1994  . Bipolar 1 disorder   . Kidney infection   . Pancreas (digestive gland) works poorly   . Anemia   . Kidney stones     Past Surgical History  Procedure Laterality Date  . Hand surgery    . Hand surgery    . Tonsillectomy    . Therapeutic abortion      medically indicated  . Back surgery      Family History  Problem Relation Age of Onset  . Anxiety disorder Mother   . COPD Mother   . Hypertension Father   . Anxiety disorder Father   . COPD Father   . Cancer Maternal Grandmother     ovarian    History  Substance Use Topics  . Smoking status: Current Every Day Smoker -- 0.25 packs/day for 23 years    Types: Cigarettes  . Smokeless tobacco: Never Used  . Alcohol Use: 0.6 oz/week    1 Glasses of wine per week     Comment: not wkly    Allergies:  Allergies  Allergen Reactions  . Prednisone Other (See Comments)    delusions.  . Penicillins Rash    Prescriptions  prior to admission  Medication Sig Dispense Refill Last Dose  . amphetamine-dextroamphetamine (ADDERALL) 10 MG tablet Take 2 tablets (20 mg total) by mouth 2 (two) times daily with a meal. (Patient not taking: Reported on 08/04/2014) 28 tablet 0   . ARIPiprazole (ABILIFY) 2 MG tablet Take 1 tablet (2 mg total) by mouth daily. (Patient not taking: Reported on 08/04/2014) 30 tablet 0   . clonazePAM (KLONOPIN) 1 MG tablet Take 1 tablet (1 mg total) by mouth 3 (three) times daily. 42 tablet 0   . clonazePAM (KLONOPIN) 1 MG tablet Take 1 mg by mouth 2 (two) times daily as needed for anxiety.   08/03/2014 at Unknown time  . cyclobenzaprine (FLEXERIL) 10 MG tablet Take 1 tablet (10 mg total) by mouth 3 (three) times daily as needed for muscle spasms. (Patient not taking: Reported on 08/04/2014) 30 tablet 0   . DULoxetine (CYMBALTA) 30 MG capsule Take 1 capsule (30 mg total) by mouth daily. (Patient not taking: Reported on 08/04/2014) 30 capsule 0   . escitalopram (LEXAPRO) 10  MG tablet Take 5 mg by mouth daily. 25 mg total   08/03/2014 at Unknown time  . escitalopram (LEXAPRO) 20 MG tablet Take 20 mg by mouth daily. 25 mg total   08/03/2014 at Unknown time  . gabapentin (NEURONTIN) 300 MG capsule Take 300 mg by mouth 2 (two) times daily.   08/03/2014 at Unknown time  . HYDROcodone-acetaminophen (NORCO) 7.5-325 MG per tablet Take 1 tablet by mouth every 6 (six) hours as needed for moderate pain. (Patient not taking: Reported on 08/04/2014) 30 tablet 0   . HYDROcodone-acetaminophen (NORCO/VICODIN) 5-325 MG per tablet Take 1 tablet by mouth every 6 (six) hours as needed for moderate pain. (Patient not taking: Reported on 08/04/2014) 12 tablet 0   . hydrOXYzine (ATARAX/VISTARIL) 25 MG tablet Take 1 tablet (25 mg total) by mouth every 6 (six) hours as needed for anxiety. (Patient not taking: Reported on 08/04/2014) 30 tablet 0   . ibuprofen (ADVIL,MOTRIN) 200 MG tablet Take 400 mg by mouth every 6 (six) hours as  needed for pain. For pain   08/03/2014 at Unknown time  . lisdexamfetamine (VYVANSE) 60 MG capsule Take 60 mg by mouth every morning.   08/03/2014 at Unknown time  . metroNIDAZOLE (FLAGYL) 500 MG tablet Take 1 tablet (500 mg total) by mouth 3 (three) times daily. (Patient not taking: Reported on 08/04/2014) 14 tablet 0   . mirtazapine (REMERON) 15 MG tablet Take 1 tablet (15 mg total) by mouth at bedtime. (Patient not taking: Reported on 08/04/2014) 30 tablet 0   . ondansetron (ZOFRAN ODT) 4 MG disintegrating tablet Take 1 tablet (4 mg total) by mouth every 8 (eight) hours as needed for nausea or vomiting. 20 tablet 0   . orphenadrine (NORFLEX) 100 MG tablet Take 1 tablet (100 mg total) by mouth 2 (two) times daily. (Patient not taking: Reported on 08/04/2014) 10 tablet 0   . predniSONE (DELTASONE) 10 MG tablet Take 2 tablets (20 mg total) by mouth daily. 10 tablet 0   . Tetrahydrozoline HCl (VISINE OP) Place 1 drop into both eyes daily as needed (itchy eyes). Itchy eyes   08/03/2014 at Unknown time    ROS Physical Exam   Blood pressure 110/58, pulse 96, temperature 98.2 F (36.8 C), temperature source Oral, resp. rate 18.  Physical Exam  Constitutional: She is oriented to person, place, and time. She appears well-developed and well-nourished. No distress.  HENT:  Head: Normocephalic and atraumatic.  Eyes: EOM are normal.  Neck: Normal range of motion.  Cardiovascular: Normal rate and regular rhythm.   Respiratory: Effort normal and breath sounds normal. No respiratory distress.  GI: Soft. Bowel sounds are normal. She exhibits no distension. There is no tenderness.  Musculoskeletal:  Tenderness of musculature bilaterally across neck and shoulders.  Tension palpable.  Neurological: She is alert and oriented to person, place, and time.  Skin: Skin is warm and dry.  Psychiatric: She has a normal mood and affect.    MAU Course  Procedures  MDM Discussed with Dr. Gaetano Net.  Labs  reviewed.  MD agreeable to discharge of pt to home with follow up labs in MAU for Day 7.     Assessment and Plan  A:  1. Ectopic pregnancy without intrauterine pregnancy   2. Bilateral low back pain without sciatica    PLAN: Discharge to home Warm compressest for back pain May use ibuprofen prn back pain   Phenergan prn nausea Return on Wednesday for Day 7 follow up labs Patient may  return to MAU as needed or if her condition were to change or worsen Pt very tearful stating this is not sufficient regimen for back pain.  Limited # vicodin provided. Follow up with PCP  Jaclyn Prime, Collene Leyden 08/07/2014, 2:30 PM

## 2014-08-07 NOTE — Discharge Instructions (Signed)
°Ectopic Pregnancy °An ectopic pregnancy is when the fertilized egg attaches (implants) outside the uterus. Most ectopic pregnancies occur in the fallopian tube. Rarely do ectopic pregnancies occur on the ovary, intestine, pelvis, or cervix. In an ectopic pregnancy, the fertilized egg does not have the ability to develop into a normal, healthy baby.  °A ruptured ectopic pregnancy is one in which the fallopian tube gets torn or bursts and results in internal bleeding. Often there is intense abdominal pain, and sometimes, vaginal bleeding. Having an ectopic pregnancy can be life threatening. If left untreated, this dangerous condition can lead to a blood transfusion, abdominal surgery, or even death. °CAUSES  °Damage to the fallopian tubes is the suspected cause in most ectopic pregnancies.  °RISK FACTORS °Depending on your circumstances, the risk of having an ectopic pregnancy will vary. The level of risk can be divided into three categories. °High Risk °· You have gone through infertility treatment. °· You have had a previous ectopic pregnancy. °· You have had previous tubal surgery. °· You have had previous surgery to have the fallopian tubes tied (tubal ligation). °· You have tubal problems or diseases. °· You have been exposed to DES. DES is a medicine that was used until 1971 and had effects on babies whose mothers took the medicine. °· You become pregnant while using an intrauterine device (IUD) for birth control.  °Moderate Risk °· You have a history of infertility. °· You have a history of a sexually transmitted infection (STI). °· You have a history of pelvic inflammatory disease (PID). °· You have scarring from endometriosis. °· You have multiple sexual partners. °· You smoke.  °Low Risk °· You have had previous pelvic surgery. °· You use vaginal douching. °· You became sexually active before 36 years of age. °SIGNS AND SYMPTOMS  °An ectopic pregnancy should be suspected in anyone who has missed a period  and has abdominal pain or bleeding. °· You may experience normal pregnancy symptoms, such as: °¨ Nausea. °¨ Tiredness. °¨ Breast tenderness. °· Other symptoms may include: °¨ Pain with intercourse. °¨ Irregular vaginal bleeding or spotting. °¨ Cramping or pain on one side or in the lower abdomen. °¨ Fast heartbeat. °¨ Passing out while having a bowel movement. °· Symptoms of a ruptured ectopic pregnancy and internal bleeding may include: °¨ Sudden, severe pain in the abdomen and pelvis. °¨ Dizziness or fainting. °¨ Pain in the shoulder area. °DIAGNOSIS  °Tests that may be performed include: °· A pregnancy test. °· An ultrasound test. °· Testing the specific level of pregnancy hormone in the bloodstream. °· Taking a sample of uterus tissue (dilation and curettage, D&C). °· Surgery to perform a visual exam of the inside of the abdomen using a thin, lighted tube with a tiny camera on the end (laparoscope). °TREATMENT  °An injection of a medicine called methotrexate may be given. This medicine causes the pregnancy tissue to be absorbed. It is given if: °· The diagnosis is made early. °· The fallopian tube has not ruptured. °· You are considered to be a good candidate for the medicine. °Usually, pregnancy hormone blood levels are checked after methotrexate treatment. This is to be sure the medicine is effective. It may take 4-6 weeks for the pregnancy to be absorbed (though most pregnancies will be absorbed by 3 weeks). °Surgical treatment may be needed. A laparoscope may be used to remove the pregnancy tissue. If severe internal bleeding occurs, a cut (incision) may be made in the lower abdomen (laparotomy), and the ectopic   pregnancy is removed. This stops the bleeding. Part of the fallopian tube, or the whole tube, may be removed as well (salpingectomy). After surgery, pregnancy hormone tests may be done to be sure there is no pregnancy tissue left. You may receive a Rho (D) immune globulin shot if you are Rh negative  and the father is Rh positive, or if you do not know the Rh type of the father. This is to prevent problems with any future pregnancy. °SEEK IMMEDIATE MEDICAL CARE IF:  °You have any symptoms of an ectopic pregnancy. This is a medical emergency. °MAKE SURE YOU: °· Understand these instructions. °· Will watch your condition. °· Will get help right away if you are not doing well or get worse. °Document Released: 08/29/2004 Document Revised: 12/06/2013 Document Reviewed: 02/18/2013 °[employer]® Patient Information ©2015 [employer]. This information is not intended to replace advice given to you by your health care provider. Make sure you discuss any questions you have with your health care provider. ° ° °

## 2014-08-07 NOTE — MAU Note (Signed)
Pt presents to MAU for follow up from methotrexate injection on December the 31st. Pt is experiencing shoulder, back and neck pain with severe nausea

## 2014-08-10 ENCOUNTER — Inpatient Hospital Stay (HOSPITAL_COMMUNITY)
Admission: AD | Admit: 2014-08-10 | Discharge: 2014-08-10 | Disposition: A | Discharge status: Home / Self Care | Payer: 59 | Source: Ambulatory Visit | Attending: Obstetrics and Gynecology | Admitting: Obstetrics and Gynecology

## 2014-08-10 DIAGNOSIS — N949 Unspecified condition associated with female genital organs and menstrual cycle: Secondary | ICD-10-CM

## 2014-08-10 DIAGNOSIS — N9489 Other specified conditions associated with female genital organs and menstrual cycle: Secondary | ICD-10-CM | POA: Insufficient documentation

## 2014-08-10 DIAGNOSIS — R1030 Lower abdominal pain, unspecified: Secondary | ICD-10-CM | POA: Diagnosis not present

## 2014-08-10 DIAGNOSIS — M545 Low back pain: Secondary | ICD-10-CM | POA: Diagnosis not present

## 2014-08-10 LAB — HCG, QUANTITATIVE, PREGNANCY: hCG, Beta Chain, Quant, S: 41 m[IU]/mL — ABNORMAL HIGH (ref <5)

## 2014-08-10 NOTE — MAU Provider Note (Signed)
Subjective:  [name] is a 34 y.o. female G2P0010 at Unknown gestation who presents for a follow up beta hcg level. She was diagnosed with a L. Adnexal mass on 12/31 and sent to MAU for MTX treatment. She continues to have mild, bilateral lower abdominal pain and lower back pain that has been present since she found out about the ectopic. The pain has remained the same. She rates her lower back pain 6/10.    Objective:  GENERAL: Well-developed, well-nourished female in no acute distress. Patient is very tearful. HEENT: Normocephalic, atraumatic.   LUNGS: Effort normal HEART: Regular rate  SKIN: Warm, dry and without erythema PSYCH: Normal mood and affect   There were no vitals filed for this visit.  Results for orders placed or performed during the hospital encounter of 08/10/14 (from the past 48 hour(s))  hCG, quantitative, pregnancy     Status: Abnormal   Collection Time: 08/10/14  2:55 PM  Result Value Ref Range   hCG, Beta Chain, Quant, S 41 (H) <5 mIU/mL    Comment:          GEST. AGE      CONC.  (mIU/mL)   <=1 WEEK        5 - 50     2 WEEKS       50 - 500     3 WEEKS       100 - 10,000     4 WEEKS     1,000 - 30,000     5 WEEKS     3,500 - 115,000   6-8 WEEKS     12,000 - 270,000    12 WEEKS     15,000 - 220,000        FEMALE AND NON-PREGNANT FEMALE:     LESS THAN 5 mIU/mL     MDM: Beta hcg 12/31: 59 1/3: 51 1/6: 41  1620: spoke to Dr. Matthew Sosa and reviewed beta hcg levels.   Assessment:  1. Adnexal mass     Plan:  Discharge home in stable condition Patient is to call the office this week to schedule an appointment with Dr. Gaetano Sosa Ectopic precautions Pelvic rest Return to MAU if symptoms worsen.    Paula Hillock Catharina Pica, NP] 08/10/2014 4:40 PM

## 2014-08-16 ENCOUNTER — Inpatient Hospital Stay (HOSPITAL_COMMUNITY)
Admission: AD | Admit: 2014-08-16 | Discharge: 2014-08-16 | Disposition: A | Discharge status: Home / Self Care | Payer: 59 | Source: Ambulatory Visit | Attending: Obstetrics and Gynecology | Admitting: Obstetrics and Gynecology

## 2014-08-16 DIAGNOSIS — O009 Unspecified ectopic pregnancy without intrauterine pregnancy: Secondary | ICD-10-CM

## 2014-08-16 DIAGNOSIS — O001 Tubal pregnancy: Secondary | ICD-10-CM | POA: Diagnosis not present

## 2014-08-16 LAB — COMPREHENSIVE METABOLIC PANEL
ALK PHOS: 45 U/L (ref 39–117)
ALT: 13 U/L (ref 0–35)
AST: 15 U/L (ref 0–37)
Albumin: 4.3 g/dL (ref 3.5–5.2)
Anion gap: 4 — ABNORMAL LOW (ref 5–15)
BILIRUBIN TOTAL: 0.1 mg/dL — AB (ref 0.3–1.2)
BUN: 16 mg/dL (ref 6–23)
CHLORIDE: 107 meq/L (ref 96–112)
CO2: 29 mmol/L (ref 19–32)
Calcium: 9.3 mg/dL (ref 8.4–10.5)
Creatinine, Ser: 0.7 mg/dL (ref 0.50–1.10)
GFR calc Af Amer: >90 mL/min (ref >90)
GFR calc non Af Amer: >90 mL/min (ref >90)
Glucose, Bld: 106 mg/dL — ABNORMAL HIGH (ref 70–99)
POTASSIUM: 4.1 mmol/L (ref 3.5–5.1)
SODIUM: 140 mmol/L (ref 135–145)
TOTAL PROTEIN: 6.8 g/dL (ref 6.0–8.3)

## 2014-08-16 LAB — CBC
HCT: 39.1 % (ref 36.0–46.0)
HEMOGLOBIN: 13.3 g/dL (ref 12.0–15.0)
MCH: 31.2 pg (ref 26.0–34.0)
MCHC: 34 g/dL (ref 30.0–36.0)
MCV: 91.8 fL (ref 78.0–100.0)
Platelets: 186 10*3/uL (ref 150–400)
RBC: 4.26 MIL/uL (ref 3.87–5.11)
RDW: 14.3 % (ref 11.5–15.5)
WBC: 10.1 10*3/uL (ref 4.0–10.5)

## 2014-08-16 LAB — HCG, QUANTITATIVE, PREGNANCY: hCG, Beta Chain, Quant, S: 29 m[IU]/mL — ABNORMAL HIGH (ref <5)

## 2014-08-16 MED ORDER — DOXYCYCLINE HYCLATE 100 MG IV SOLR
100.0000 mg | Freq: Once | INTRAVENOUS | Status: DC
  Filled 2014-08-16: qty 100

## 2014-08-16 NOTE — Discharge Instructions (Signed)
Results for MIATA, CULBRETH (MRN [id number]) as of 08/16/2014 10:59  Ref. Range 08/04/2014 13:44 08/07/2014 12:32 08/10/2014 14:55 08/16/2014 10:15  hCG, Beta Chain, Quant, S Latest Range: <5 mIU/mL 59 (H) 51 (H) 41 (H) 29 (H)   Your pregnancy hormone level is dropping appropriately.   Methotrexate Treatment for an Ectopic Pregnancy, Care After Refer to this sheet in the next few weeks. These instructions provide you with information on caring for yourself after your procedure. Your health care provider may also give you more specific instructions. Your treatment has been planned according to current medical practices, but problems sometimes occur. Call your health care provider if you have any problems or questions after your procedure. WHAT TO EXPECT AFTER THE PROCEDURE You may have some abdominal cramping, vaginal bleeding, and fatigue in the first few days after taking methotrexate. Some other possible side effects of methotrexate include:  Nausea.  Vomiting.  Diarrhea.  Mouth sores.  Swelling or irritation of the lining of your lungs (pneumonitis).  Liver damage.  Hair loss. HOME CARE INSTRUCTIONS  After you have received the methotrexate medicine, you need to be careful of your activities and watch your condition for several weeks. It may take 1 week before your hormone levels return to normal.  Keep all follow-up appointments as directed by your health care provider.  Avoid traveling too far away from your health care provider.  Do not have sexual intercourse until your health care provider says it is safe to do so.  You may resume your usual diet.  Limit strenuous activity.  Do not take folic acid, prenatal vitamins, or other vitamins that contain folic acid.  Do not take aspirin, ibuprofen, or naproxen (nonsteroidal anti-inflammatory drugs [NSAIDs]).  Do not drink alcohol. SEEK MEDICAL CARE IF:   You cannot control your nausea and vomiting.  You cannot control your  diarrhea.  You have sores in your mouth and want treatment.  You need pain medicine for your abdominal pain.  You have a rash.  You are having a reaction to the medicine. SEEK IMMEDIATE MEDICAL CARE IF:   You have increasing abdominal or pelvic pain.  You notice increased bleeding.  You feel light-headed, or you faint.  You have shortness of breath.  Your heart rate increases.  You have a cough.  You have chills.  You have a fever. Document Released: 07/11/2011 Document Revised: 07/27/2013 Document Reviewed: 05/10/2013 [facility] Patient Information 2015 [place], [place]. This information is not intended to replace advice given to you by your health care provider. Make sure you discuss any questions you have with your health care provider.

## 2014-08-16 NOTE — MAU Provider Note (Signed)
History   Chief Complaint:  No chief complaint on file.   Paula Sosa is  34 y.o. G2P0010 No LMP recorded (lmp unknown). Patient is pregnant.. Patient is here for follow up of quantitative HCG and ongoing surveillance of pregnancy status.   She is 12 days status post methotrexate injection for left ectopic pregnancy.   Since her last visit, the patient is without new complaint.   The patient reports bleeding as  lighter than period.  Denies pain.  General ROS:  negative  Her previous Quantitative HCG values are:  Results for Paula Sosa (MRN [id number]) as of 08/16/2014 09:59  Ref. Range 08/04/2014 13:44 08/07/2014 12:32 08/10/2014 14:55  hCG, Beta Chain, Quant, S Latest Range: <5 mIU/mL 59 (H) 51 (H) 41 (H)    Physical Exam   Blood pressure 126/82, pulse 87, temperature 98.8 F (37.1 C), temperature source Oral, resp. rate 18.  Focused Gynecological Exam: examination not indicated  Labs: Results for orders placed or performed during the hospital encounter of 08/16/14 (from the past 24 hour(s))  hCG, quantitative, pregnancy   Collection Time: 08/16/14 10:15 AM  Result Value Ref Range   hCG, Beta Chain, Quant, S 29 (H) <5 mIU/mL  CBC   Collection Time: 08/16/14 10:15 AM  Result Value Ref Range   WBC 10.1 4.0 - 10.5 K/uL   RBC 4.26 3.87 - 5.11 MIL/uL   Hemoglobin 13.3 12.0 - 15.0 g/dL   HCT 39.1 36.0 - 46.0 %   MCV 91.8 78.0 - 100.0 fL   MCH 31.2 26.0 - 34.0 pg   MCHC 34.0 30.0 - 36.0 g/dL   RDW 14.3 11.5 - 15.5 %   Platelets 186 150 - 400 K/uL  Comprehensive metabolic panel   Collection Time: 08/16/14 10:15 AM  Result Value Ref Range   Sodium 140 135 - 145 mmol/L   Potassium 4.1 3.5 - 5.1 mmol/L   Chloride 107 96 - 112 mEq/L   CO2 29 19 - 32 mmol/L   Glucose, Bld 106 (H) 70 - 99 mg/dL   BUN 16 6 - 23 mg/dL   Creatinine, Ser 0.70 0.50 - 1.10 mg/dL   Calcium 9.3 8.4 - 10.5 mg/dL   Total Protein 6.8 6.0 - 8.3 g/dL   Albumin 4.3 3.5 - 5.2 g/dL   AST 15 0 - 37 U/L    ALT 13 0 - 35 U/L   Alkaline Phosphatase 45 39 - 117 U/L   Total Bilirubin 0.1 (L) 0.3 - 1.2 mg/dL   GFR calc non Af Amer >90 >90 mL/min   GFR calc Af Amer >90 >90 mL/min   Anion gap 4 (L) 5 - 15    Ultrasound Studies:   No results found.  Assessment: 12 days status post methotrexate for left ectopic pregnancy with appropriate (29%) drop in hCG level since last visit.  Plan: Discharge home in stable condition per consult with Dr. Tressia Danas. Follow-up Information    Follow up with Cyril Mourning, MD.   Specialty:  Obstetrics and Gynecology   Why:  Weekly until hCG levels less than 1. Call office to cancel [facility] scheduled August 17, 2014   Contact information:   [address] [place] [address] [phone]        Medication List    TAKE these medications        clonazePAM 1 MG tablet  Commonly known as:  KLONOPIN  Take 1 tablet (1 mg total) by mouth 3 (three) times  daily.     escitalopram 20 MG tablet  Commonly known as:  LEXAPRO  Take 20 mg by mouth daily. Pt takes with half of a 10mg  tablet for a total dose of 25mg  daily.     escitalopram 10 MG tablet  Commonly known as:  LEXAPRO  Take 5 mg by mouth daily. Pt takes with a 20mg  tablet for a total dose of 25mg  daily     gabapentin 300 MG capsule  Commonly known as:  NEURONTIN  Take 300 mg by mouth 2 (two) times daily.     HYDROcodone-acetaminophen 5-325 MG per tablet  Commonly known as:  NORCO/VICODIN  Take 1-2 tablets by mouth every 6 (six) hours as needed for moderate pain.     ibuprofen 800 MG tablet  Commonly known as:  ADVIL,MOTRIN  Take 800 mg by mouth every 8 (eight) hours as needed.     lisdexamfetamine 60 MG capsule  Commonly known as:  VYVANSE  Take 60 mg by mouth every morning.     promethazine 25 MG tablet  Commonly known as:  PHENERGAN  Take 1 tablet (25 mg total) by mouth every 6 (six) hours as needed for nausea or vomiting.     VISINE OP  Place 1 drop into both eyes  daily as needed (for itchy eyes).        Manya Silvas 08/16/2014, 9:59 AM

## 2014-08-16 NOTE — MAU Note (Signed)
Pt here for follow up lab work post MTX.  Pt denies pain, having intermittent bleeding - states it is slowing down, has occasional gush that soaks a panti liner.  Scheduled for possible D&C tomorrow.

## 2014-08-17 ENCOUNTER — Ambulatory Visit (HOSPITAL_COMMUNITY): Admission: RE | Admit: 2014-08-17 | Payer: 59 | Source: Ambulatory Visit | Admitting: Obstetrics & Gynecology

## 2014-08-17 ENCOUNTER — Encounter (HOSPITAL_COMMUNITY): Admission: RE | Payer: Self-pay | Source: Ambulatory Visit

## 2014-08-17 SURGERY — DILATION AND CURETTAGE
Anesthesia: Choice

## 2015-04-21 ENCOUNTER — Ambulatory Visit (INDEPENDENT_AMBULATORY_CARE_PROVIDER_SITE_OTHER): Payer: 59 | Admitting: Family Medicine

## 2015-04-21 VITALS — BP 122/72 | HR 81 | Temp 98.0°F | Resp 17 | Ht 68.5 in | Wt 156.0 lb

## 2015-04-21 DIAGNOSIS — M542 Cervicalgia: Secondary | ICD-10-CM | POA: Diagnosis not present

## 2015-04-21 DIAGNOSIS — S46812A Strain of other muscles, fascia and tendons at shoulder and upper arm level, left arm, initial encounter: Secondary | ICD-10-CM | POA: Diagnosis not present

## 2015-04-21 MED ORDER — DICLOFENAC SODIUM 75 MG PO TBEC: 75.0000 mg | DELAYED_RELEASE_TABLET | Freq: Two times a day (BID) | ORAL | Status: DC

## 2015-04-21 MED ORDER — HYDROCODONE-ACETAMINOPHEN 5-325 MG PO TABS: 1.0000 | ORAL_TABLET | Freq: Four times a day (QID) | ORAL | Status: DC | PRN

## 2015-04-21 MED ORDER — METAXALONE 800 MG PO TABS: 800.0000 mg | ORAL_TABLET | Freq: Three times a day (TID) | ORAL | Status: DC

## 2015-04-21 NOTE — Progress Notes (Signed)
Left shoulder pain Subjective:  Patient ID: Paula Sosa, female    DOB: May 12, 1981  Age: 34 y.o. MRN: [id number]  Patient is here complaining of neck and left shoulder pain. She has been under good deal of stress as she is preparing for a new job that she is going to work. She studies all the time. There is about a one-month training. She has manual still learn. She also did do some lifting of some fairly heavy trays. She does not do a lot of regular exercise.  Had a problem with cocaine some years ago. Has taken hydrocodone since then. Does not have a problem with that.   Objective:   Looks uncomfortable. She is holding her neck in a very stiff and rigid fashion. She has normal TMs. Throat clear. Neck is tender in the left side of the neck in the anterior aspect of the trapezius muscle and down to where the muscle attaches to the scapula. Hurts to fully abduct her left arm. Shoulder and arm nontender. Grip and motion of the hand good.  Assessment & Plan:   Assessment:  Left trapezius strain and pain with neck and shoulder pain  Plan:  Muscle relaxants, exercise, and inflammatory, and nighttime pain medicine.  Patient Instructions  Take the Skelaxin (metaxalone) one half to one pill 3 times daily for muscle relaxant   Take the diclofenac one pill twice daily for pain and inflammation  Take the hydrocodone at bedtime only if needed for severe pain  Alternate ice and heat  Do stretching exercises as discussed  Return if further problems    HOPPER,DAVID, MD 04/21/2015

## 2015-04-21 NOTE — Patient Instructions (Signed)
Take the Skelaxin (metaxalone) one half to one pill 3 times daily for muscle relaxant   Take the diclofenac one pill twice daily for pain and inflammation  Take the hydrocodone at bedtime only if needed for severe pain  Alternate ice and heat  Do stretching exercises as discussed  Return if further problems

## 2015-09-07 ENCOUNTER — Encounter (HOSPITAL_COMMUNITY): Payer: Self-pay | Admitting: Emergency Medicine

## 2015-09-07 ENCOUNTER — Emergency Department (HOSPITAL_COMMUNITY)
Admission: EM | Admit: 2015-09-07 | Discharge: 2015-09-07 | Disposition: A | Discharge status: Home / Self Care | Payer: PRIVATE HEALTH INSURANCE | Attending: Emergency Medicine | Admitting: Emergency Medicine

## 2015-09-07 DIAGNOSIS — Z791 Long term (current) use of non-steroidal anti-inflammatories (NSAID): Secondary | ICD-10-CM | POA: Diagnosis not present

## 2015-09-07 DIAGNOSIS — F319 Bipolar disorder, unspecified: Secondary | ICD-10-CM | POA: Diagnosis not present

## 2015-09-07 DIAGNOSIS — Z79899 Other long term (current) drug therapy: Secondary | ICD-10-CM | POA: Insufficient documentation

## 2015-09-07 DIAGNOSIS — Z8614 Personal history of Methicillin resistant Staphylococcus aureus infection: Secondary | ICD-10-CM | POA: Diagnosis not present

## 2015-09-07 DIAGNOSIS — Z87442 Personal history of urinary calculi: Secondary | ICD-10-CM | POA: Diagnosis not present

## 2015-09-07 DIAGNOSIS — Z862 Personal history of diseases of the blood and blood-forming organs and certain disorders involving the immune mechanism: Secondary | ICD-10-CM | POA: Insufficient documentation

## 2015-09-07 DIAGNOSIS — Z8719 Personal history of other diseases of the digestive system: Secondary | ICD-10-CM | POA: Insufficient documentation

## 2015-09-07 DIAGNOSIS — Z880 Allergy status to penicillin: Secondary | ICD-10-CM | POA: Diagnosis not present

## 2015-09-07 DIAGNOSIS — Z87448 Personal history of other diseases of urinary system: Secondary | ICD-10-CM | POA: Insufficient documentation

## 2015-09-07 DIAGNOSIS — Z87891 Personal history of nicotine dependence: Secondary | ICD-10-CM | POA: Diagnosis not present

## 2015-09-07 DIAGNOSIS — R110 Nausea: Secondary | ICD-10-CM | POA: Diagnosis present

## 2015-09-07 DIAGNOSIS — N390 Urinary tract infection, site not specified: Secondary | ICD-10-CM

## 2015-09-07 DIAGNOSIS — Z3202 Encounter for pregnancy test, result negative: Secondary | ICD-10-CM | POA: Diagnosis not present

## 2015-09-07 LAB — URINALYSIS, ROUTINE W REFLEX MICROSCOPIC
Bilirubin Urine: NEGATIVE
Glucose, UA: NEGATIVE mg/dL
Ketones, ur: NEGATIVE mg/dL
Nitrite: NEGATIVE
PH: 5 (ref 5.0–8.0)
Protein, ur: NEGATIVE mg/dL
SPECIFIC GRAVITY, URINE: 1.022 (ref 1.005–1.030)

## 2015-09-07 LAB — URINE MICROSCOPIC-ADD ON

## 2015-09-07 LAB — POC URINE PREG, ED: Preg Test, Ur: NEGATIVE

## 2015-09-07 MED ORDER — PHENAZOPYRIDINE HCL 200 MG PO TABS: 200.0000 mg | ORAL_TABLET | Freq: Three times a day (TID) | ORAL | Status: DC

## 2015-09-07 MED ORDER — NITROFURANTOIN MONOHYD MACRO 100 MG PO CAPS
100.0000 mg | ORAL_CAPSULE | Freq: Once | ORAL | Status: AC
  Administered 2015-09-07: 100 mg via ORAL
  Filled 2015-09-07: qty 1

## 2015-09-07 MED ORDER — NITROFURANTOIN MONOHYD MACRO 100 MG PO CAPS: 100.0000 mg | ORAL_CAPSULE | Freq: Two times a day (BID) | ORAL | Status: DC

## 2015-09-07 NOTE — ED Notes (Signed)
Patient c/o lower back pain onset earlier yesterday evening, with nausea. States feels similar to UTI. History of same. Denies urinary frequency, dysuria, urgency, fever or chills.

## 2015-09-07 NOTE — ED Notes (Signed)
Patient d/c'd self care.  F/U and medications reviewed.  Patient verbalized understanding. 

## 2015-09-07 NOTE — ED Notes (Signed)
Patient states took 800mg  Ibuprofen at 1900 09/06/15.  States pain currently 6/10.  C/O nausea, denies vomiting, denies fever.

## 2015-09-07 NOTE — ED Notes (Signed)
Patient ambulatory to restroom  ?

## 2015-09-07 NOTE — ED Provider Notes (Signed)
CSN: [id number]     Arrival date & time 09/07/15  0018 History  By signing my name below, I, [name], attest that this documentation has been prepared under the direction and in the presence of Golden Gilreath, MD. Electronically Signed: [name], ED Scribe. 09/07/2015. 12:43 AM   No chief complaint on file.  Patient is a 35 y.o. female presenting with back pain. The history is provided by the patient. No language interpreter was used.  Back Pain Location:  Lumbar spine Radiates to: across the back. Pain severity:  Moderate Onset quality:  Gradual Timing:  Constant Progression:  Unchanged Chronicity:  New Context: not falling, not jumping from heights, not lifting heavy objects, not MCA, not MVA, not occupational injury, not pedestrian accident, not recent injury and not twisting   Relieved by:  Nothing Worsened by:  Nothing tried Ineffective treatments:  None tried Associated symptoms: no abdominal pain, no bladder incontinence, no bowel incontinence, no chest pain, no dysuria, no fever, no headaches, no pelvic pain and no tingling    Paula Sosa is a 35 y.o. female with history of back pain, kidney stones, and UTI who presents to the Emergency Department complaining of constant, 6/10 in severity, atraumatic,  lower mid back pain with onset tonight. She describes the pain as "like someone is trying to snap my back in half". Pt states that the pain starts at the mid back and radiates outward across the lower back. Associated symptoms include nausea and malodorous urine. Pt denies dysuria, increased frequency, difficulty with urinary stream, diarrhea, vomiting. Pt states that she has had several UTIs in the past that did not have any associated symptoms. She is currently on abx prescribed by her OB.      Past Medical History  Diagnosis Date  . Depression   . MRSA (methicillin resistant staph aureus) culture positive 1994  . Bipolar 1 disorder ([place])   . Kidney infection    . 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 Anemia   . Kidney stones    Past Surgical History  Procedure Laterality Date  . Hand surgery    . Hand surgery    . Tonsillectomy    . Therapeutic abortion      medically indicated  . Back surgery     Family History  Problem Relation Age of Onset  . Anxiety disorder Mother   . COPD Mother   . Hypertension Father   . Anxiety disorder Father   . COPD Father   . Cancer Maternal Grandmother     ovarian   Social History  Substance Use Topics  . Smoking status: Former Smoker -- 0.25 packs/day for 23 years    Types: Cigarettes    Quit date: 04/20/2014  . Smokeless tobacco: Never Used  . Alcohol Use: 0.6 oz/week    1 Glasses of wine per week     Comment: not wkly   OB History    Gravida Para Term Preterm AB TAB SAB Ectopic Multiple Living   2 0 0 0 1 1 0 0 0 0      Review of Systems  Constitutional: Negative for fever.  Cardiovascular: Negative for chest pain.  Gastrointestinal: Positive for nausea. Negative for vomiting, abdominal pain and bowel incontinence.  Genitourinary: Negative for bladder incontinence, dysuria and pelvic pain.       Malodorous urine  Musculoskeletal: Positive for back pain.  Neurological: Negative for tingling and headaches.  All other systems reviewed and are negative.  Allergies  Prednisone and Penicillins  Home Medications   Prior to Admission medications   Medication Sig Start Date End Date Taking? Authorizing Provider  buPROPion (WELLBUTRIN XL) 300 MG 24 hr tablet Take 300 mg by mouth daily.    Historical Provider, MD  clonazePAM (KLONOPIN) 1 MG tablet Take 1 tablet (1 mg total) by mouth 3 (three) times daily. Patient not taking: Reported on 04/21/2015 05/23/13   Patrecia Pour, NP  diclofenac (VOLTAREN) 75 MG EC tablet Take 1 tablet (75 mg total) by mouth 2 (two) times daily. 04/21/15   Posey Boyer, MD  HYDROcodone-acetaminophen (NORCO) 5-325 MG per tablet Take 1 tablet by mouth  every 6 (six) hours as needed. 04/21/15   Posey Boyer, MD  ibuprofen (ADVIL,MOTRIN) 800 MG tablet Take 800 mg by mouth every 8 (eight) hours as needed.    Historical Provider, MD  lamoTRIgine (LAMICTAL) 100 MG tablet Take 100 mg by mouth daily.    Historical Provider, MD  metaxalone (SKELAXIN) 800 MG tablet Take 1 tablet (800 mg total) by mouth 3 (three) times daily. 04/21/15   Posey Boyer, MD  Tetrahydrozoline HCl (VISINE OP) Place 1 drop into both eyes daily as needed (for itchy eyes).     Historical Provider, MD   BP 114/61 mmHg  Pulse 98  Temp(Src) 97.4 F (36.3 C) (Oral)  SpO2 100%  LMP 08/26/2015 Physical Exam  Constitutional: She is oriented to person, place, and time. She appears well-developed and well-nourished.  HENT:  Head: Normocephalic.  Mouth/Throat: Oropharynx is clear and moist.  Moist mucous membranes No exudate  Eyes: EOM are normal. Pupils are equal, round, and reactive to light.  Neck: Normal range of motion.  Trachea midline  Cardiovascular: Normal rate, regular rhythm and intact distal pulses.   Pulmonary/Chest: Effort normal and breath sounds normal. She has no wheezes. She has no rales.  Abdominal: Soft. She exhibits no mass. There is no tenderness. There is no rebound and no guarding.  Musculoskeletal: Normal range of motion.       Cervical back: Normal.       Thoracic back: Normal.       Lumbar back: Normal.  No step off or crepitance of cervical, thoracic, or lumbar spine  Neurological: She is alert and oriented to person, place, and time. She has normal reflexes.  Skin: Skin is warm and dry.  Psychiatric: She has a normal mood and affect. Her behavior is normal.  Nursing note and vitals reviewed.   ED Course  Procedures (including critical care time) DIAGNOSTIC STUDIES: Oxygen Saturation is 100% on RA,  normal by my interpretation.    COORDINATION OF CARE: 12:31 AM Discussed treatment plan which includes lab work with pt at bedside and pt  agreed to plan.  Labs Review Labs Reviewed  URINALYSIS, ROUTINE W REFLEX MICROSCOPIC (NOT AT [facility])  POC URINE PREG, ED    Imaging Review No results found.  I personally reviewed and evaluated these lab results as a part of my medical decision-making.   EKG Interpretation None      MDM   Final diagnoses:  None    Will treat for UTI.  Follow up with your PMD for recheck.  Strict return precautions given  I personally performed the services described in this documentation, which was scribed in my presence. The recorded information has been reviewed and is accurate.      Veatrice Kells, MD 09/07/15 0130

## 2016-09-01 ENCOUNTER — Emergency Department (HOSPITAL_COMMUNITY)
Admission: EM | Admit: 2016-09-01 | Discharge: 2016-09-01 | Disposition: A | Discharge status: Home / Self Care | Payer: PRIVATE HEALTH INSURANCE | Attending: Emergency Medicine | Admitting: Emergency Medicine

## 2016-09-01 ENCOUNTER — Encounter (HOSPITAL_COMMUNITY): Payer: Self-pay | Admitting: Emergency Medicine

## 2016-09-01 DIAGNOSIS — Y999 Unspecified external cause status: Secondary | ICD-10-CM | POA: Diagnosis not present

## 2016-09-01 DIAGNOSIS — Z87891 Personal history of nicotine dependence: Secondary | ICD-10-CM | POA: Diagnosis not present

## 2016-09-01 DIAGNOSIS — X58XXXA Exposure to other specified factors, initial encounter: Secondary | ICD-10-CM | POA: Insufficient documentation

## 2016-09-01 DIAGNOSIS — S161XXA Strain of muscle, fascia and tendon at neck level, initial encounter: Secondary | ICD-10-CM | POA: Diagnosis not present

## 2016-09-01 DIAGNOSIS — M436 Torticollis: Secondary | ICD-10-CM

## 2016-09-01 DIAGNOSIS — Y929 Unspecified place or not applicable: Secondary | ICD-10-CM | POA: Diagnosis not present

## 2016-09-01 DIAGNOSIS — Y939 Activity, unspecified: Secondary | ICD-10-CM | POA: Insufficient documentation

## 2016-09-01 DIAGNOSIS — S199XXA Unspecified injury of neck, initial encounter: Secondary | ICD-10-CM | POA: Diagnosis present

## 2016-09-01 MED ORDER — NAPROXEN 500 MG PO TABS: 500.0000 mg | ORAL_TABLET | Freq: Two times a day (BID) | ORAL | 0 refills | Status: DC

## 2016-09-01 MED ORDER — DIAZEPAM 5 MG PO TABS
10.0000 mg | ORAL_TABLET | Freq: Once | ORAL | Status: AC
  Administered 2016-09-01: 10 mg via ORAL
  Filled 2016-09-01: qty 2

## 2016-09-01 MED ORDER — DIAZEPAM 10 MG PO TABS: 10.0000 mg | ORAL_TABLET | Freq: Four times a day (QID) | ORAL | 0 refills | Status: DC | PRN

## 2016-09-01 MED ORDER — NAPROXEN 250 MG PO TABS
500.0000 mg | ORAL_TABLET | Freq: Once | ORAL | Status: AC
  Administered 2016-09-01: 500 mg via ORAL
  Filled 2016-09-01: qty 2

## 2016-09-01 NOTE — ED Notes (Signed)
Pt crying stating "no one is doing nothing for my pain".  Pt last took medications this morning at 9am.

## 2016-09-01 NOTE — ED Triage Notes (Signed)
Pt. Stated, I just moved and started packing a month ago and my neck and back have been hurting for a month and really bad last night. I bar tend. I went to [facility] this morning and was given Flexeril and an anti-inflammatory.  Its actually gotten worse.  I was also treated for an UTI with an antibiotic.

## 2016-09-01 NOTE — ED Provider Notes (Signed)
[facility] DEPT Provider Note  CSN: [id number] Arrival date & time: 09/01/16  1445  History   Chief Complaint Chief Complaint  Patient presents with  . Torticollis    muscle  . Back Pain   HPI Paula Sosa is a 36 y.o. female.  The history is provided by the patient and medical records. No language interpreter was used.  Illness  This is a new problem. The current episode started yesterday. The problem occurs constantly. The problem has been gradually worsening. Pertinent negatives include no chest pain, no abdominal pain, no headaches and no shortness of breath. Exacerbated by: Movement, palpation. Nothing relieves the symptoms.    Past Medical History:  Diagnosis Date  . Anemia   . Bipolar 1 disorder ([place])   . Depression   . Kidney infection   . Kidney stones   . MRSA (methicillin resistant staph aureus) culture positive 1994  . Pancreas (digestive gland) works poorly    Patient Active Problem List   Diagnosis Date Noted  . Severe recurrent major depression ([place]) 05/19/2013  . Depressive disorder 05/18/2013   Past Surgical History:  Procedure Laterality Date  . BACK SURGERY    . HAND SURGERY    . HAND SURGERY    . THERAPEUTIC ABORTION     medically indicated  . TONSILLECTOMY     OB History    Gravida Para Term Preterm AB Living   2 0 0 0 1 0   SAB TAB Ectopic Multiple Live Births   0 1 0 0       Home Medications    Prior to Admission medications   Medication Sig Start Date End Date Taking? Authorizing Provider  buPROPion (WELLBUTRIN XL) 150 MG 24 hr tablet Take 150 mg by mouth daily.    Historical Provider, MD  clonazePAM (KLONOPIN) 1 MG tablet Take 1 tablet (1 mg total) by mouth 3 (three) times daily. Patient not taking: Reported on 04/21/2015 05/23/13   Patrecia Pour, NP  diazepam (VALIUM) 10 MG tablet Take 1 tablet (10 mg total) by mouth every 6 (six) hours as needed for anxiety. 09/01/16   Mayer Camel, MD  diclofenac (VOLTAREN) 75 MG EC tablet  Take 1 tablet (75 mg total) by mouth 2 (two) times daily. Patient not taking: Reported on 09/07/2015 04/21/15   Posey Boyer, MD  HYDROcodone-acetaminophen [facility]) 5-325 MG per tablet Take 1 tablet by mouth every 6 (six) hours as needed. Patient not taking: Reported on 09/07/2015 04/21/15   Posey Boyer, MD  ibuprofen (ADVIL,MOTRIN) 800 MG tablet Take 800 mg by mouth every 8 (eight) hours as needed for moderate pain.     Historical Provider, MD  lamoTRIgine (LAMICTAL) 150 MG tablet Take 150 mg by mouth daily.    Historical Provider, MD  metaxalone (SKELAXIN) 800 MG tablet Take 1 tablet (800 mg total) by mouth 3 (three) times daily. Patient not taking: Reported on 09/07/2015 04/21/15   Posey Boyer, MD  metroNIDAZOLE (FLAGYL) 500 MG tablet Take 500 mg by mouth 2 (two) times daily.    Historical Provider, MD  naproxen (NAPROSYN) 500 MG tablet Take 1 tablet (500 mg total) by mouth 2 (two) times daily with a meal. 09/01/16   Mayer Camel, MD  nitrofurantoin, macrocrystal-monohydrate, (MACROBID) 100 MG capsule Take 1 capsule (100 mg total) by mouth 2 (two) times daily. X 7 days 09/07/15   April Palumbo, MD  phenazopyridine (PYRIDIUM) 200 MG tablet Take 1 tablet (200 mg total) by mouth  3 (three) times daily. 09/07/15   April Palumbo, MD  Tetrahydrozoline HCl (VISINE OP) Place 1 drop into both eyes daily as needed (for itchy eyes).     Historical Provider, MD   Family History Family History  Problem Relation Age of Onset  . Cancer Maternal Grandmother     ovarian  . Anxiety disorder Mother   . COPD Mother   . Hypertension Father   . Anxiety disorder Father   . COPD Father    Social History Social History  Substance Use Topics  . Smoking status: Former Smoker    Packs/day: 0.25    Years: 23.00    Types: Cigarettes    Quit date: 04/20/2014  . Smokeless tobacco: Former Systems developer  . Alcohol use 0.6 oz/week    1 Glasses of wine per week     Comment: not wkly    Allergies   Prednisone and  Penicillins  Review of Systems Review of Systems  Constitutional: Negative for chills and fever.  Respiratory: Negative for shortness of breath.   Cardiovascular: Negative for chest pain.  Gastrointestinal: Negative for abdominal pain.  Genitourinary: Positive for dysuria.  Musculoskeletal: Positive for neck pain.  Neurological: Negative for headaches.  All other systems reviewed and are negative.   Physical Exam Updated Vital Signs BP 128/87   Pulse 102   Temp 98.6 F (37 C) (Oral)   Resp 18   Ht 5\' 8"  (1.727 m)   Wt 61.2 kg   LMP 08/05/2016   SpO2 100%   BMI 20.53 kg/m   Physical Exam  Constitutional: She is oriented to person, place, and time. She appears well-developed and well-nourished. No distress.  HENT:  Head: Normocephalic and atraumatic.  Eyes: EOM are normal. Pupils are equal, round, and reactive to light.  Neck: Normal range of motion. Neck supple.  Cardiovascular: Normal rate, regular rhythm and normal heart sounds.   Pulmonary/Chest: Effort normal and breath sounds normal.  Abdominal: Soft. Bowel sounds are normal. She exhibits no distension. There is no tenderness.  Musculoskeletal: Normal range of motion. She exhibits tenderness (right paraspinal cervical spine). She exhibits no edema or deformity.  No midline C/T/L TTP, NVI to RUE  Neurological: She is alert and oriented to person, place, and time.  Skin: Skin is warm and dry. Capillary refill takes less than 2 seconds. She is not diaphoretic.  Nursing note and vitals reviewed.   ED Treatments / Results  Labs (all labs ordered are listed, but only abnormal results are displayed) Labs Reviewed - No data to display  EKG  EKG Interpretation None      Radiology No results found.  Procedures Procedures (including critical care time)  Medications Ordered in ED Medications  diazepam (VALIUM) tablet 10 mg (10 mg Oral Given 09/01/16 1747)  naproxen (NAPROSYN) tablet 500 mg (500 mg Oral Given  09/01/16 1747)    Initial Impression / Assessment and Plan / ED Course  I have reviewed the triage vital signs and the nursing notes.  36 y.o. female with above stated PMHx, HPI, and physical. History as above. Patient being treated outpatient with ciprofloxacin for UTI with improvement in symptoms. Onset of right neck pain ever past 24-48 hours. Recent heavy lifting. Patient denies direct injury or impact. No previous history of neck injury or surgery. Patient seen at urgent care earlier today and was given prescription for Flexeril and diclofenac of which she is taking 1 pill each. Patient states that her pain has not gotten any better.  History and exam consistent with muscle spasm/torticollis. No imaging or labs assert this time. Patient given 1 dose of vitamin naproxen prescription for each.  Pt discharged home in stable condition. Strict ED return precautions dicussed. Pt understands and agrees with the plan and has no further questions or concerns.   Pt care discussed with and followed by my attending, Dr. Tanna Furry  Mayer Camel, MD Pager [phone]  Final Clinical Impressions(s) / ED Diagnoses   Final diagnoses:  Acute strain of neck muscle, initial encounter  Torticollis, acute   New Prescriptions New Prescriptions   DIAZEPAM (VALIUM) 10 MG TABLET    Take 1 tablet (10 mg total) by mouth every 6 (six) hours as needed for anxiety.   NAPROXEN (NAPROSYN) 500 MG TABLET    Take 1 tablet (500 mg total) by mouth 2 (two) times daily with a meal.     Mayer Camel, MD 09/01/16 1749    Tanna Furry, MD 09/12/16 [phone]

## 2016-10-09 ENCOUNTER — Other Ambulatory Visit: Payer: Self-pay | Admitting: Orthopedic Surgery

## 2016-11-03 DIAGNOSIS — G5601 Carpal tunnel syndrome, right upper limb: Secondary | ICD-10-CM

## 2016-11-03 HISTORY — DX: Carpal tunnel syndrome, right upper limb: G56.01

## 2016-11-14 ENCOUNTER — Encounter (HOSPITAL_BASED_OUTPATIENT_CLINIC_OR_DEPARTMENT_OTHER): Payer: Self-pay | Admitting: *Deleted

## 2016-11-18 ENCOUNTER — Encounter (HOSPITAL_BASED_OUTPATIENT_CLINIC_OR_DEPARTMENT_OTHER): Payer: Self-pay | Admitting: *Deleted

## 2016-11-21 ENCOUNTER — Ambulatory Visit (HOSPITAL_BASED_OUTPATIENT_CLINIC_OR_DEPARTMENT_OTHER): Payer: PRIVATE HEALTH INSURANCE | Admitting: Anesthesiology

## 2016-11-21 ENCOUNTER — Encounter (HOSPITAL_BASED_OUTPATIENT_CLINIC_OR_DEPARTMENT_OTHER)
Admission: RE | Disposition: A | Discharge status: Home / Self Care | Payer: Self-pay | Source: Ambulatory Visit | Attending: Orthopedic Surgery

## 2016-11-21 ENCOUNTER — Encounter (HOSPITAL_BASED_OUTPATIENT_CLINIC_OR_DEPARTMENT_OTHER): Payer: Self-pay | Admitting: Anesthesiology

## 2016-11-21 ENCOUNTER — Ambulatory Visit (HOSPITAL_BASED_OUTPATIENT_CLINIC_OR_DEPARTMENT_OTHER)
Admission: RE | Admit: 2016-11-21 | Discharge: 2016-11-21 | Disposition: A | Discharge status: Home / Self Care | Payer: PRIVATE HEALTH INSURANCE | Source: Ambulatory Visit | Attending: Orthopedic Surgery | Admitting: Orthopedic Surgery

## 2016-11-21 DIAGNOSIS — G5601 Carpal tunnel syndrome, right upper limb: Secondary | ICD-10-CM | POA: Insufficient documentation

## 2016-11-21 DIAGNOSIS — F17210 Nicotine dependence, cigarettes, uncomplicated: Secondary | ICD-10-CM | POA: Diagnosis not present

## 2016-11-21 DIAGNOSIS — Z87442 Personal history of urinary calculi: Secondary | ICD-10-CM | POA: Insufficient documentation

## 2016-11-21 DIAGNOSIS — Z880 Allergy status to penicillin: Secondary | ICD-10-CM | POA: Diagnosis not present

## 2016-11-21 DIAGNOSIS — Z8614 Personal history of Methicillin resistant Staphylococcus aureus infection: Secondary | ICD-10-CM | POA: Insufficient documentation

## 2016-11-21 HISTORY — DX: Personal history of Methicillin resistant Staphylococcus aureus infection: Z86.14

## 2016-11-21 HISTORY — DX: Arthralgia of temporomandibular joint, unspecified side: M26.629

## 2016-11-21 HISTORY — DX: Other specified postprocedural states: Z98.890

## 2016-11-21 HISTORY — PX: CARPAL TUNNEL RELEASE: SHX101

## 2016-11-21 HISTORY — DX: Personal history of urinary calculi: Z87.442

## 2016-11-21 HISTORY — DX: Other seasonal allergic rhinitis: J30.2

## 2016-11-21 HISTORY — DX: Carpal tunnel syndrome, right upper limb: G56.01

## 2016-11-21 HISTORY — DX: Nausea with vomiting, unspecified: R11.2

## 2016-11-21 HISTORY — DX: Hyperesthesia: R20.3

## 2016-11-21 SURGERY — CARPAL TUNNEL RELEASE
Anesthesia: Regional | Site: Wrist | Laterality: Right

## 2016-11-21 MED ORDER — LIDOCAINE HCL (PF) 0.5 % IJ SOLN
INTRAMUSCULAR | Status: DC | PRN
  Administered 2016-11-21: 35 mL via INTRAVENOUS

## 2016-11-21 MED ORDER — ONDANSETRON HCL 4 MG/2ML IJ SOLN
INTRAMUSCULAR | Status: AC
  Filled 2016-11-21: qty 2

## 2016-11-21 MED ORDER — PROPOFOL 500 MG/50ML IV EMUL
INTRAVENOUS | Status: AC
  Filled 2016-11-21: qty 50

## 2016-11-21 MED ORDER — ONDANSETRON HCL 4 MG/2ML IJ SOLN
INTRAMUSCULAR | Status: DC | PRN
  Administered 2016-11-21: 4 mg via INTRAVENOUS

## 2016-11-21 MED ORDER — KETOROLAC TROMETHAMINE 30 MG/ML IJ SOLN
INTRAMUSCULAR | Status: DC | PRN
  Administered 2016-11-21: 30 mg via INTRAVENOUS

## 2016-11-21 MED ORDER — LACTATED RINGERS IV SOLN: INTRAVENOUS | Status: DC

## 2016-11-21 MED ORDER — FENTANYL CITRATE (PF) 100 MCG/2ML IJ SOLN
INTRAMUSCULAR | Status: AC
  Filled 2016-11-21: qty 2

## 2016-11-21 MED ORDER — KETOROLAC TROMETHAMINE 30 MG/ML IJ SOLN
INTRAMUSCULAR | Status: AC
  Filled 2016-11-21: qty 1

## 2016-11-21 MED ORDER — MIDAZOLAM HCL 2 MG/2ML IJ SOLN
1.0000 mg | INTRAMUSCULAR | Status: DC | PRN
  Administered 2016-11-21 (×2): 2 mg via INTRAVENOUS

## 2016-11-21 MED ORDER — LIDOCAINE HCL (CARDIAC) 20 MG/ML IV SOLN
INTRAVENOUS | Status: DC | PRN
  Administered 2016-11-21: 20 mg via INTRAVENOUS

## 2016-11-21 MED ORDER — FENTANYL CITRATE (PF) 100 MCG/2ML IJ SOLN
25.0000 ug | INTRAMUSCULAR | Status: DC | PRN
  Administered 2016-11-21: 25 ug via INTRAVENOUS
  Administered 2016-11-21: 50 ug via INTRAVENOUS

## 2016-11-21 MED ORDER — FENTANYL CITRATE (PF) 100 MCG/2ML IJ SOLN
50.0000 ug | INTRAMUSCULAR | Status: AC | PRN
  Administered 2016-11-21: 100 ug via INTRAVENOUS
  Administered 2016-11-21 (×2): 50 ug via INTRAVENOUS

## 2016-11-21 MED ORDER — LACTATED RINGERS IV SOLN
INTRAVENOUS | Status: DC
  Administered 2016-11-21: 08:00:00 via INTRAVENOUS

## 2016-11-21 MED ORDER — METOCLOPRAMIDE HCL 5 MG/ML IJ SOLN: 10.0000 mg | Freq: Once | INTRAMUSCULAR | Status: DC | PRN

## 2016-11-21 MED ORDER — HYDROCODONE-ACETAMINOPHEN 5-325 MG PO TABS: 1.0000 | ORAL_TABLET | Freq: Four times a day (QID) | ORAL | 0 refills | Status: DC | PRN

## 2016-11-21 MED ORDER — BUPIVACAINE HCL (PF) 0.25 % IJ SOLN
INTRAMUSCULAR | Status: DC | PRN
  Administered 2016-11-21: 6 mL

## 2016-11-21 MED ORDER — MEPERIDINE HCL 25 MG/ML IJ SOLN: 6.2500 mg | INTRAMUSCULAR | Status: DC | PRN

## 2016-11-21 MED ORDER — CHLORHEXIDINE GLUCONATE 4 % EX LIQD: 60.0000 mL | Freq: Once | CUTANEOUS | Status: DC

## 2016-11-21 MED ORDER — MIDAZOLAM HCL 2 MG/2ML IJ SOLN
INTRAMUSCULAR | Status: AC
  Filled 2016-11-21: qty 2

## 2016-11-21 MED ORDER — VANCOMYCIN HCL IN DEXTROSE 1-5 GM/200ML-% IV SOLN
INTRAVENOUS | Status: AC
  Filled 2016-11-21: qty 200

## 2016-11-21 MED ORDER — VANCOMYCIN HCL IN DEXTROSE 1-5 GM/200ML-% IV SOLN
1000.0000 mg | INTRAVENOUS | Status: AC
  Administered 2016-11-21: 1000 mg via INTRAVENOUS

## 2016-11-21 MED ORDER — BUPIVACAINE HCL (PF) 0.5 % IJ SOLN
INTRAMUSCULAR | Status: AC
  Filled 2016-11-21: qty 30

## 2016-11-21 MED ORDER — SCOPOLAMINE 1 MG/3DAYS TD PT72: 1.0000 | MEDICATED_PATCH | Freq: Once | TRANSDERMAL | Status: DC | PRN

## 2016-11-21 MED ORDER — LIDOCAINE 2% (20 MG/ML) 5 ML SYRINGE
INTRAMUSCULAR | Status: AC
  Filled 2016-11-21: qty 5

## 2016-11-21 MED ORDER — PROPOFOL 10 MG/ML IV BOLUS
INTRAVENOUS | Status: DC | PRN
  Administered 2016-11-21 (×3): 20 mg via INTRAVENOUS

## 2016-11-21 MED ORDER — BUPIVACAINE HCL (PF) 0.25 % IJ SOLN
INTRAMUSCULAR | Status: AC
  Filled 2016-11-21: qty 30

## 2016-11-21 SURGICAL SUPPLY — 36 items
BLADE SURG 15 STRL LF DISP TIS (BLADE) ×1 IMPLANT
BLADE SURG 15 STRL SS (BLADE) ×3
BNDG CMPR 9X4 STRL LF SNTH (GAUZE/BANDAGES/DRESSINGS) ×1
BNDG COHESIVE 3X5 TAN STRL LF (GAUZE/BANDAGES/DRESSINGS) ×3 IMPLANT
BNDG ESMARK 4X9 LF (GAUZE/BANDAGES/DRESSINGS) ×3 IMPLANT
BNDG GAUZE ELAST 4 BULKY (GAUZE/BANDAGES/DRESSINGS) ×3 IMPLANT
CHLORAPREP W/TINT 26ML (MISCELLANEOUS) ×3 IMPLANT
CORDS BIPOLAR (ELECTRODE) ×3 IMPLANT
COVER BACK TABLE 60X90IN (DRAPES) ×3 IMPLANT
COVER MAYO STAND STRL (DRAPES) ×3 IMPLANT
CUFF TOURNIQUET SINGLE 18IN (TOURNIQUET CUFF) ×3 IMPLANT
DRAPE EXTREMITY T 121X128X90 (DRAPE) ×3 IMPLANT
DRAPE SURG 17X23 STRL (DRAPES) ×3 IMPLANT
DRSG PAD ABDOMINAL 8X10 ST (GAUZE/BANDAGES/DRESSINGS) ×3 IMPLANT
GAUZE SPONGE 4X4 12PLY STRL (GAUZE/BANDAGES/DRESSINGS) ×3 IMPLANT
GAUZE XEROFORM 1X8 LF (GAUZE/BANDAGES/DRESSINGS) ×3 IMPLANT
GLOVE BIOGEL PI IND STRL 7.0 (GLOVE) ×3 IMPLANT
GLOVE BIOGEL PI IND STRL 8.5 (GLOVE) ×1 IMPLANT
GLOVE BIOGEL PI INDICATOR 7.0 (GLOVE) ×6
GLOVE BIOGEL PI INDICATOR 8.5 (GLOVE) ×2
GLOVE ECLIPSE 6.5 STRL STRAW (GLOVE) ×3 IMPLANT
GLOVE SURG ORTHO 8.0 STRL STRW (GLOVE) ×3 IMPLANT
GLOVE SURG SS PI 6.5 STRL IVOR (GLOVE) ×2 IMPLANT
GOWN STRL REUS W/ TWL LRG LVL3 (GOWN DISPOSABLE) ×1 IMPLANT
GOWN STRL REUS W/TWL LRG LVL3 (GOWN DISPOSABLE) ×6
GOWN STRL REUS W/TWL XL LVL3 (GOWN DISPOSABLE) ×3 IMPLANT
NEEDLE PRECISIONGLIDE 27X1.5 (NEEDLE) ×3 IMPLANT
NS IRRIG 1000ML POUR BTL (IV SOLUTION) ×3 IMPLANT
PACK BASIN DAY SURGERY FS (CUSTOM PROCEDURE TRAY) ×3 IMPLANT
STOCKINETTE 4X48 STRL (DRAPES) ×3 IMPLANT
SUT ETHILON 4 0 PS 2 18 (SUTURE) ×3 IMPLANT
SUT VICRYL 4-0 PS2 18IN ABS (SUTURE) IMPLANT
SYR BULB 3OZ (MISCELLANEOUS) ×3 IMPLANT
SYR CONTROL 10ML LL (SYRINGE) ×3 IMPLANT
TOWEL OR 17X24 6PK STRL BLUE (TOWEL DISPOSABLE) ×3 IMPLANT
UNDERPAD 30X30 (UNDERPADS AND DIAPERS) IMPLANT

## 2016-11-21 NOTE — Anesthesia Procedure Notes (Signed)
Anesthesia Regional Block: Bier block (IV Regional)   Pre-Anesthetic Checklist: ,, timeout performed, Correct Patient, Correct Site, Correct Laterality, Correct Procedure,, site marked, surgical consent,, at surgeon's request  Laterality: Right     Needles:  Injection technique: Single-shot  Needle Type: Other      Needle Gauge: 20     Additional Needles:   Procedures:,,,,,,, Esmarch exsanguination, single tourniquet utilized,  Narrative:   Performed by: Personally

## 2016-11-21 NOTE — Op Note (Signed)
Dictation Number [id number]
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
## 2016-11-21 NOTE — Op Note (Signed)
Paula Sosa, Paula Sosa                ACCOUNT NO.:  [id number]  MEDICAL RECORD NO.:  [id number]  LOCATION:                                 FACILITY:  PHYSICIAN:  Daryll Brod, M.D.            DATE OF BIRTH:  DATE OF PROCEDURE:  11/21/2016 DATE OF DISCHARGE:                              OPERATIVE REPORT   PREOPERATIVE DIAGNOSIS:  Carpal tunnel syndrome, right hand.  POSTOPERATIVE DIAGNOSIS:  Carpal tunnel syndrome, right hand.  OPERATION:  Decompression of right median nerve.  SURGEON:  Daryll Brod, MD.  ASSISTANT:  None.  ANESTHESIA:  Forearm-based IV regional with local infiltration.  PLACE OF SURGERY:  [facility].  HISTORY:  The patient is a 36 year old female with a history of carpal tunnel syndrome, EMG nerve conductions which has not responded to conservative treatment.  Pre, peri and postoperative course have been discussed along with risks and complications.  She is aware that there is no guarantee to the surgery; the possibility of infection; recurrence of injury to arteries, nerves, tendons; incomplete relief of symptoms and dystrophy.  In the preoperative area, the patient is seen, the extremity was marked by both the patient and the surgeon.  Antibiotic given.  PROCEDURE IN DETAIL:  The patient was brought to the operating room, where a forearm-based IV regional anesthetic was carried out without difficulty under the direction of the Anesthesia Department.  She was prepped using ChloraPrep in supine position with the right arm free.  A 3-minute dry time was allowed.  Time-out taken, confirming the patient and procedure.  After adequate anesthesia was afforded, a longitudinal incision was made in the right palm.  This was carried down through subcutaneous tissue.  Bleeders were electrocauterized with bipolar.  The palmar fascia was split.  Superficial palmar arch was identified.  The flexor tendon to the ring and little finger was identified.  To the ulnar  side of the median nerve, the carpal retinaculum was incised with sharp dissection.  A right-angle and Sewall retractor were placed between skin and forearm fascia.  The dorsal structures to the fascia were bluntly dissected free from the overlying fascia.  The fascia was then released for approximately 2 cm proximal to the wrist crease under direct vision.  The canal was explored.  The motor branch entered into muscle distally, and area of compression to the median nerve was apparent.  No further lesions were identified.  The wound was copiously irrigated with saline.  The skin was then closed with interrupted 4-0 nylon sutures.  A local infiltration with 0.25% bupivacaine without epinephrine was given, approximately 8 mL was used.  A sterile compressive dressing with the fingers and thumb free was applied.  On deflation of the tourniquet, all fingers immediately pinked.  She was taken to the recovery room for observation in satisfactory condition.  She will be discharged to home to return to [place] in 1 week on Norco.          ______________________________ Daryll Brod, M.D.     GK/MEDQ  D:  11/21/2016  T:  11/21/2016  Job:  [id number]

## 2016-11-21 NOTE — Brief Op Note (Signed)
11/21/2016  9:01 AM  PATIENT:  Paula Sosa  36 y.o. female  PRE-OPERATIVE DIAGNOSIS:  RIGHT CARPAL TUNNEL SYNDROME  POST-OPERATIVE DIAGNOSIS:  RIGHT CARPAL TUNNEL SYNDROME  PROCEDURE:  Procedure(s) with comments: RIGHT CARPAL TUNNEL RELEASE (Right) - REG/FAB  SURGEON:  Surgeon(s) and Role:    * Daryll Brod, MD - Primary  PHYSICIAN ASSISTANT:   ASSISTANTS: none   ANESTHESIA:   local and regional  EBL:  Total I/O In: 500 [I.V.:500] Out: 2 [Blood:2]  BLOOD ADMINISTERED:none  DRAINS: none   LOCAL MEDICATIONS USED:  BUPIVICAINE   SPECIMEN:  No Specimen  DISPOSITION OF SPECIMEN:  N/A  COUNTS:  YES  TOURNIQUET:   Total Tourniquet Time Documented: Forearm (Right) - 19 minutes Total: Forearm (Right) - 19 minutes   DICTATION: .Other Dictation: Dictation Number [id number]  PLAN OF CARE: Discharge to home after PACU  PATIENT DISPOSITION:  PACU - hemodynamically stable.

## 2016-11-21 NOTE — Anesthesia Preprocedure Evaluation (Signed)
Anesthesia Evaluation  Patient identified by MRN, date of birth, ID band Patient awake    Reviewed: Allergy & Precautions, NPO status , Patient's Chart, lab work & pertinent test results  History of Anesthesia Complications (+) PONV  Airway Mallampati: II  TM Distance: >3 FB Neck ROM: Full    Dental no notable dental hx.    Pulmonary neg pulmonary ROS, Current Smoker,    Pulmonary exam normal breath sounds clear to auscultation       Cardiovascular negative cardio ROS Normal cardiovascular exam Rhythm:Regular Rate:Normal     Neuro/Psych negative neurological ROS  negative psych ROS   GI/Hepatic negative GI ROS, Neg liver ROS,   Endo/Other  negative endocrine ROS  Renal/GU negative Renal ROS  negative genitourinary   Musculoskeletal negative musculoskeletal ROS (+)   Abdominal   Peds negative pediatric ROS (+)  Hematology negative hematology ROS (+)   Anesthesia Other Findings   Reproductive/Obstetrics negative OB ROS                             Anesthesia Physical Anesthesia Plan  ASA: II  Anesthesia Plan: Bier Block   Post-op Pain Management:    Induction:   Airway Management Planned: Nasal Cannula  Additional Equipment:   Intra-op Plan:   Post-operative Plan:   Informed Consent: I have reviewed the patients History and Physical, chart, labs and discussed the procedure including the risks, benefits and alternatives for the proposed anesthesia with the patient or authorized representative who has indicated his/her understanding and acceptance.   Dental advisory given  Plan Discussed with: CRNA  Anesthesia Plan Comments:         Anesthesia Quick Evaluation

## 2016-11-21 NOTE — Transfer of Care (Signed)
Immediate Anesthesia Transfer of Care Note  Patient: Paula Sosa  Procedure(s) Performed: Procedure(s) with comments: RIGHT CARPAL TUNNEL RELEASE (Right) - REG/FAB  Patient Location: PACU  Anesthesia Type:MAC and Bier block  Level of Consciousness: awake and sedated  Airway & Oxygen Therapy: Patient Spontanous Breathing  Post-op Assessment: Report given to RN and Post -op Vital signs reviewed and stable  Post vital signs: Reviewed and stable  Last Vitals:  Vitals:   11/21/16 0901 11/21/16 0902  BP: 95/70   Pulse: 73 73  Resp:  12  Temp:      Last Pain:  Vitals:   11/21/16 0746  TempSrc: Oral  PainSc: 6       Patients Stated Pain Goal: 3 (02/77/41 2878)  Complications: No apparent anesthesia complications

## 2016-11-21 NOTE — Discharge Instructions (Addendum)

## 2016-11-21 NOTE — Anesthesia Procedure Notes (Signed)
Procedure Name: MAC Performed by: Jamarques Pinedo W Pre-anesthesia Checklist: Patient identified, Timeout performed, Emergency Drugs available, Suction available and Patient being monitored Patient Re-evaluated:Patient Re-evaluated prior to induction Oxygen Delivery Method: Simple face mask       

## 2016-11-21 NOTE — Anesthesia Postprocedure Evaluation (Signed)
Anesthesia Post Note  Patient: Paula Sosa  Procedure(s) Performed: Procedure(s) (LRB): RIGHT CARPAL TUNNEL RELEASE (Right)  Patient location during evaluation: PACU Anesthesia Type: Bier Block Level of consciousness: awake and alert Pain management: pain level controlled Vital Signs Assessment: post-procedure vital signs reviewed and stable Respiratory status: spontaneous breathing, nonlabored ventilation, respiratory function stable and patient connected to nasal cannula oxygen Cardiovascular status: stable and blood pressure returned to baseline Anesthetic complications: no       Last Vitals:  Vitals:   11/21/16 0930 11/21/16 1013  BP: (!) 85/62 101/71  Pulse: 68 79  Resp: 13 18  Temp:  36.6 C    Last Pain:  Vitals:   11/21/16 1013  TempSrc: Oral  PainSc: 3                  Montez Hageman

## 2016-11-22 ENCOUNTER — Encounter (HOSPITAL_BASED_OUTPATIENT_CLINIC_OR_DEPARTMENT_OTHER): Payer: Self-pay | Admitting: Orthopedic Surgery

## 2019-02-08 ENCOUNTER — Other Ambulatory Visit: Payer: Self-pay

## 2019-02-08 ENCOUNTER — Ambulatory Visit
Admission: EM | Admit: 2019-02-08 | Discharge: 2019-02-08 | Disposition: A | Discharge status: Home / Self Care | Payer: PRIVATE HEALTH INSURANCE | Attending: Physician Assistant | Admitting: Physician Assistant

## 2019-02-08 DIAGNOSIS — R600 Localized edema: Secondary | ICD-10-CM

## 2019-02-08 MED ORDER — FUROSEMIDE 40 MG PO TABS: 40.0000 mg | ORAL_TABLET | Freq: Every day | ORAL | 0 refills | Status: DC

## 2019-02-08 NOTE — Discharge Instructions (Signed)
Start lasix as directed. We will get your blood work back tomorrow to make sure you can continue the water pill. Make sure to decrease salt intake. Elevate your legs. Compression stockings or ace wrap as we discussed can also help fluid decrease. Follow up with PCP if symptoms not improving. If experiencing chest pain, shortness of breath, worsening swelling with pain to the calf, redness, warmth, go to the ED for further evaluation needed.

## 2019-02-08 NOTE — ED Triage Notes (Signed)
Pt c/o swelling with pain and tightness to bilateral lower legs/ankle/feet x5 days

## 2019-02-08 NOTE — ED Provider Notes (Signed)
[facility]    CSN: [id number] Arrival date & time: 02/08/19  1617     History   Chief Complaint Chief Complaint  Patient presents with  . Leg Swelling    HPI Paula Sosa is a 38 y.o. female.   38 year old female comes in for 5 day history of bilateral lower extremity swelling. She denies any injury/trauma. She has elevated with some relief of swelling. denies any increase in activity. Denies erythema, warmth. Denies chest pain, shortness of breath, palpitations. Denies orthopnea, weakness, dizziness, syncope. Denies abdominal pain, nausea, vomiting. Denies long hours of immobility/travel, oral birth control use, history of blood clots. Denies history of heart disease, kidney disease.      Past Medical History:  Diagnosis Date  . Carpal tunnel syndrome on right 11/2016  . History of kidney stones   . History of MRSA infection ~ 2003   tattoo on abd. became infected  . PONV (postoperative nausea and vomiting)   . Seasonal allergies   . Sensitive skin   . TMJ syndrome     Patient Active Problem List   Diagnosis Date Noted  . Severe recurrent major depression ([place]) 05/19/2013  . Depressive disorder 05/18/2013    Past Surgical History:  Procedure Laterality Date  . CARPAL TUNNEL RELEASE Right 11/21/2016   Procedure: RIGHT CARPAL TUNNEL RELEASE;  Surgeon: Daryll Brod, MD;  Location: [facility];  Service: Orthopedics;  Laterality: Right;  REG/FAB  . LUMBAR DISC SURGERY    . ORIF METACARPAL FRACTURE Left 07/24/2004   middle and ring  . THERAPEUTIC ABORTION    . TONSILLECTOMY      OB History    Gravida  2   Para  0   Term  0   Preterm  0   AB  1   Living  0     SAB  0   TAB  1   Ectopic  0   Multiple  0   Live Births               Home Medications    Prior to Admission medications   Medication Sig Start Date End Date Taking? Authorizing Provider  furosemide (LASIX) 40 MG tablet Take 1 tablet (40 mg total) by  mouth daily. 02/08/19   Tasia Catchings,  V, PA-C  HYDROcodone-acetaminophen (NORCO) 5-325 MG tablet Take 1 tablet by mouth every 6 (six) hours as needed for moderate pain. 11/21/16   Daryll Brod, MD  ibuprofen (ADVIL,MOTRIN) 200 MG tablet Take 200 mg by mouth every 6 (six) hours as needed.    [provider]    Family History Family History  Problem Relation Age of Onset  . Cancer Maternal Grandmother        ovarian  . Anxiety disorder Mother   . COPD Mother   . Hypertension Father   . Anxiety disorder Father   . COPD Father     Social History Social History   Tobacco Use  . Smoking status: Current Every Day Smoker    Packs/day: 0.25    Years: 20.00    Pack years: 5.00    Types: Cigarettes  . Smokeless tobacco: Never Used  Substance Use Topics  . Alcohol use: Yes    Comment: less than once/week  . Drug use: Yes    Types: Marijuana    Comment: last used 11/17/2016     Allergies   Prednisone and Penicillins   Review of Systems Review of Systems  Reason unable to perform ROS: See HPI as above.     Physical Exam Triage Vital Signs ED Triage Vitals  Enc Vitals Group     BP 02/08/19 1628 (!) 142/101     Pulse Rate 02/08/19 1628 (!) 113     Resp 02/08/19 1628 20     Temp 02/08/19 1628 98 F (36.7 C)     Temp Source 02/08/19 1628 Oral     SpO2 02/08/19 1628 98 %     Weight --      Height --      Head Circumference --      Peak Flow --      Pain Score 02/08/19 1629 2     Pain Loc --      Pain Edu? --      Excl. in [place]? --    No data found.  Updated Vital Signs BP 122/80 (BP Location: Left Arm)   Pulse 97   Temp 98 F (36.7 C) (Oral)   Resp 20   LMP 02/07/2019   SpO2 98%   Physical Exam Constitutional:      General: She is not in acute distress.    Appearance: She is well-developed. She is not diaphoretic.  HENT:     Head: Normocephalic and atraumatic.  Eyes:     Conjunctiva/sclera: Conjunctivae normal.     Pupils: Pupils are equal, round, and  reactive to light.  Neck:     Musculoskeletal: Normal range of motion and neck supple.  Cardiovascular:     Rate and Rhythm: Normal rate and regular rhythm.     Heart sounds: No murmur. No friction rub. No gallop.   Pulmonary:     Effort: Pulmonary effort is normal. No accessory muscle usage, prolonged expiration or respiratory distress.     Breath sounds: Normal breath sounds and air entry. No stridor, decreased air movement or transmitted upper airway sounds. No decreased breath sounds, wheezing, rhonchi or rales.  Musculoskeletal:     Comments: Bilateral lower extremity swelling to right below knee without erythema, warmth. 2+ pitting edema to bilateral foot and ankle, 1+ to mid shin. Full ROM of knee and ankle. Strength normal and equal bilaterally. Sensation intact and equal bilaterally.   Skin:    General: Skin is warm and dry.  Neurological:     Mental Status: She is alert and oriented to person, place, and time.    UC Treatments / Results  Labs (all labs ordered are listed, but only abnormal results are displayed) Labs Reviewed  COMPREHENSIVE METABOLIC PANEL    EKG   Radiology No results found.  Procedures Procedures (including critical care time)  Medications Ordered in UC Medications - No data to display  Initial Impression / Assessment and Plan / UC Course  I have reviewed the triage vital signs and the nursing notes.  Pertinent labs & imaging results that were available during my care of the patient were reviewed by me and considered in my medical decision making (see chart for details).    No alarming signs on exam. Tachycardia and elevated BP resolved on recheck. Lower suspicion for DVT, CHF at this time. Will have patient take short course of lasix to help with swelling. Will check CMP for kidney and liver function. Return precautions given. Patient expresses understanding and agrees to plan.  Final Clinical Impressions(s) / UC Diagnoses   Final diagnoses:   Bilateral lower extremity edema    ED Prescriptions    Medication Sig Dispense  Auth. Provider   furosemide (LASIX) 40 MG tablet Take 1 tablet (40 mg total) by mouth daily. 5 tablet Tobin Chad, [place] 02/08/19 1747

## 2019-02-09 ENCOUNTER — Telehealth: Payer: Self-pay | Admitting: Physician Assistant

## 2019-02-09 LAB — COMPREHENSIVE METABOLIC PANEL
ALT: 23 IU/L (ref 0–32)
AST: 20 IU/L (ref 0–40)
Albumin/Globulin Ratio: 2.3 — ABNORMAL HIGH (ref 1.2–2.2)
Albumin: 4.2 g/dL (ref 3.8–4.8)
Alkaline Phosphatase: 55 IU/L (ref 39–117)
BUN/Creatinine Ratio: 23 (ref 9–23)
BUN: 19 mg/dL (ref 6–20)
Bilirubin Total: <0.2 mg/dL (ref 0.0–1.2)
CO2: 25 mmol/L (ref 20–29)
Calcium: 9.3 mg/dL (ref 8.7–10.2)
Chloride: 104 mmol/L (ref 96–106)
Creatinine, Ser: 0.83 mg/dL (ref 0.57–1.00)
GFR calc Af Amer: 103 mL/min/{1.73_m2} (ref >59)
GFR calc non Af Amer: 89 mL/min/{1.73_m2} (ref >59)
Globulin, Total: 1.8 g/dL (ref 1.5–4.5)
Glucose: 89 mg/dL (ref 65–99)
Potassium: 4.2 mmol/L (ref 3.5–5.2)
Sodium: 142 mmol/L (ref 134–144)
Total Protein: 6 g/dL (ref 6.0–8.5)

## 2019-02-09 NOTE — Telephone Encounter (Signed)
Attempted to call patient for lab results. No answer. Left message to return call. Normal creatinine, to continue lasix as prescribed for lower leg swelling.

## 2019-02-11 ENCOUNTER — Telehealth (HOSPITAL_COMMUNITY): Payer: Self-pay | Admitting: Emergency Medicine

## 2019-02-11 NOTE — Telephone Encounter (Signed)
Attempted to reach patient. No answer at this time. Voicemail left.    

## 2019-12-01 ENCOUNTER — Other Ambulatory Visit: Payer: Self-pay | Admitting: Obstetrics & Gynecology

## 2019-12-01 DIAGNOSIS — R928 Other abnormal and inconclusive findings on diagnostic imaging of breast: Secondary | ICD-10-CM

## 2019-12-06 ENCOUNTER — Other Ambulatory Visit: Payer: Self-pay

## 2019-12-13 ENCOUNTER — Other Ambulatory Visit: Payer: Self-pay

## 2019-12-23 ENCOUNTER — Institutional Professional Consult (permissible substitution): Payer: Self-pay | Admitting: Plastic Surgery

## 2019-12-30 ENCOUNTER — Institutional Professional Consult (permissible substitution): Payer: Self-pay | Admitting: Plastic Surgery

## 2019-12-31 ENCOUNTER — Ambulatory Visit
Admission: RE | Admit: 2019-12-31 | Discharge: 2019-12-31 | Disposition: A | Discharge status: Home / Self Care | Payer: Self-pay | Source: Ambulatory Visit | Attending: Obstetrics & Gynecology | Admitting: Obstetrics & Gynecology

## 2019-12-31 ENCOUNTER — Other Ambulatory Visit: Payer: Self-pay

## 2019-12-31 ENCOUNTER — Other Ambulatory Visit: Payer: Self-pay | Admitting: Obstetrics & Gynecology

## 2019-12-31 ENCOUNTER — Ambulatory Visit
Admission: RE | Admit: 2019-12-31 | Discharge: 2019-12-31 | Disposition: A | Discharge status: Home / Self Care | Payer: BC Managed Care – PPO | Source: Ambulatory Visit | Attending: Obstetrics & Gynecology | Admitting: Obstetrics & Gynecology

## 2019-12-31 DIAGNOSIS — R928 Other abnormal and inconclusive findings on diagnostic imaging of breast: Secondary | ICD-10-CM

## 2019-12-31 DIAGNOSIS — N6320 Unspecified lump in the left breast, unspecified quadrant: Secondary | ICD-10-CM

## 2020-01-05 ENCOUNTER — Institutional Professional Consult (permissible substitution): Payer: Self-pay | Admitting: Plastic Surgery

## 2020-01-11 ENCOUNTER — Encounter: Payer: Self-pay | Admitting: Plastic Surgery

## 2020-01-13 ENCOUNTER — Institutional Professional Consult (permissible substitution): Payer: Self-pay | Admitting: Plastic Surgery

## 2020-02-02 ENCOUNTER — Other Ambulatory Visit: Payer: Self-pay

## 2020-02-02 ENCOUNTER — Encounter: Payer: Self-pay | Admitting: Plastic Surgery

## 2020-02-02 ENCOUNTER — Ambulatory Visit: Payer: BC Managed Care – PPO | Admitting: Plastic Surgery

## 2020-02-02 VITALS — BP 107/77 | HR 111 | Temp 98.2°F | Ht 68.0 in | Wt 155.0 lb

## 2020-02-02 DIAGNOSIS — D171 Benign lipomatous neoplasm of skin and subcutaneous tissue of trunk: Secondary | ICD-10-CM

## 2020-02-02 NOTE — Progress Notes (Signed)
Referring Provider [place], [place], PA-C [address] [place],  [place] [address]   CC:  Chief Complaint  Patient presents with  . Consult      Paula Sosa is an 39 y.o. female.  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  Is been present for years.  It is gradually getting bit bigger and can now be seen through close.  She would like to have it removed.  Allergies  Allergen Reactions  . Prednisone Other (See Comments)    PARANOIA  . Penicillins Rash    Outpatient Encounter Medications as of 02/02/2020  Medication Sig  . furosemide (LASIX) 40 MG tablet Take 1 tablet (40 mg total) by mouth daily.  Marland Kitchen HYDROcodone-acetaminophen (NORCO) 5-325 MG tablet Take 1 tablet by mouth every 6 (six) hours as needed for moderate pain.  Marland Kitchen ibuprofen (ADVIL,MOTRIN) 200 MG tablet Take 200 mg by mouth every 6 (six) hours as needed.   No facility-administered encounter medications on file as of 02/02/2020.     Past Medical History:  Diagnosis Date  . Carpal tunnel syndrome on right 11/2016  . History of kidney stones   . History of MRSA infection ~ 2003   tattoo on abd. became infected  . PONV (postoperative nausea and vomiting)   . Seasonal allergies   . Sensitive skin   . TMJ syndrome     Past Surgical History:  Procedure Laterality Date  . CARPAL TUNNEL RELEASE Right 11/21/2016   Procedure: RIGHT CARPAL TUNNEL RELEASE;  Surgeon: Daryll Brod, MD;  Location: [facility];  Service: Orthopedics;  Laterality: Right;  REG/FAB  . LUMBAR DISC SURGERY    . ORIF METACARPAL FRACTURE Left 07/24/2004   middle and ring  . THERAPEUTIC ABORTION    . TONSILLECTOMY      Family History  Problem Relation Age of Onset  . Cancer Maternal Grandmother        ovarian  . Anxiety disorder Mother   . COPD Mother   . Hypertension Father   . Anxiety disorder Father   . COPD Father     Social History   Social History Narrative  . Not on file      Review of Systems General: Denies fevers, chills, weight loss CV: Denies chest pain, shortness of breath, palpitations  Physical Exam Vitals with BMI 02/02/2020 02/08/2019 02/08/2019  Height 5\' 8"  - -  Weight 155 lbs - -  BMI 67.67 - -  Systolic 209 470 962  Diastolic 77 80 836  Pulse 111 97 113  Some encounter information is confidential and restricted. Go to Review Flowsheets activity to see all data.    General:  No acute distress,  Alert and oriented, Non-Toxic, Normal speech and affect Examination of the right lateral back shows what feels like around a 12 cm lipoma that is freely mobile.  It is soft.  There is no overlying skin changes.  Assessment/Plan Patient presents with symptomatic growing soft tissue mass in the right back.  I suspect lipoma and could be submuscular.  We discussed excision in the operating room.  We discussed the risk that include bleeding, infection, damage to surrounding structures, need for additional procedures.  We discussed the potential for recurrence.  We discussed the need and location of the scar.  All of her questions were answered and we will plan to get this scheduled for her.  [name] 02/02/2020, 1:18 PM

## 2020-02-16 ENCOUNTER — Encounter: Payer: Self-pay | Admitting: Surgical

## 2020-02-16 ENCOUNTER — Encounter (HOSPITAL_BASED_OUTPATIENT_CLINIC_OR_DEPARTMENT_OTHER): Payer: Self-pay | Admitting: Plastic Surgery

## 2020-02-16 ENCOUNTER — Ambulatory Visit (INDEPENDENT_AMBULATORY_CARE_PROVIDER_SITE_OTHER): Payer: BC Managed Care – PPO | Admitting: Surgical

## 2020-02-16 ENCOUNTER — Other Ambulatory Visit: Payer: Self-pay

## 2020-02-16 VITALS — BP 110/59 | HR 107 | Temp 98.3°F | Ht 68.0 in | Wt 174.6 lb

## 2020-02-16 DIAGNOSIS — D171 Benign lipomatous neoplasm of skin and subcutaneous tissue of trunk: Secondary | ICD-10-CM

## 2020-02-16 MED ORDER — ONDANSETRON HCL 4 MG PO TABS: 4.0000 mg | ORAL_TABLET | Freq: Three times a day (TID) | ORAL | 0 refills | Status: AC | PRN

## 2020-02-16 MED ORDER — HYDROCODONE-ACETAMINOPHEN 5-325 MG PO TABS: 1.0000 | ORAL_TABLET | Freq: Four times a day (QID) | ORAL | 0 refills | Status: AC | PRN

## 2020-02-16 NOTE — Progress Notes (Signed)
Patient ID: Paula Sosa, female    DOB: [birth year], 39 y.o.   MRN: [id number]  Chief Complaint  Patient presents with  . Pre-op Exam      ICD-10-CM   1. Lipoma of back  D17.1      History of Present Illness: Paula Sosa is a 39 y.o.  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  She presents for preoperative evaluation for upcoming procedure, excision of back lipoma, scheduled for 02/22/20 with Dr. Claudia Desanctis  Patient reports history of postop nausea and vomiting.  Does not recall she is not a scopolamine patch or not. No history of DVT/PE.  No family history of DVT/PE.  No family or personal history of bleeding or clotting disorders.  Patient is not currently taking any blood thinners.  No history of CVA/MI.   Summary of Previous Visit: Patient has a lipoma on her right mid back that has been growing over the past few years, is approximately 12 cm and freely mobile.  It is soft.  She is interested in surgical removal.  PMH Significant for: Hx of MRSA, PONV, sensitive skin. Surgical Hx of lumbar disc surgery, ORIF metacarpal fx, carpal tunnel release,   Patient is a former smoker, reports quitting approximately 8 months ago.  Past Medical History: Allergies: Allergies  Allergen Reactions  . Prednisone Other (See Comments)    PARANOIA  . Penicillins Rash    Current Medications: No current outpatient medications on file.  Past Medical Problems: Past Medical History:  Diagnosis Date  . Carpal tunnel syndrome on right 11/2016  . History of kidney stones   . History of MRSA infection ~ 2003   tattoo on abd. became infected  . PONV (postoperative nausea and vomiting)   . Seasonal allergies   . Sensitive skin   . TMJ syndrome     Past Surgical History: Past Surgical History:  Procedure Laterality Date  . CARPAL TUNNEL RELEASE Right 11/21/2016   Procedure: RIGHT CARPAL TUNNEL RELEASE;  Surgeon: Daryll Brod, MD;  Location: [facility];  Service: Orthopedics;  Laterality: Right;  REG/FAB  . LUMBAR DISC SURGERY    . ORIF METACARPAL FRACTURE Left 07/24/2004   middle and ring  . THERAPEUTIC ABORTION    . TONSILLECTOMY      Social History: Social History   Socioeconomic History  . Marital status: Single    Spouse name: Not on file  . Number of children: Not on file  . Years of education: Not on file  . Highest education level: Not on file  Occupational History  . Not on file  Tobacco Use  . Smoking status: Current Some Day Smoker    Packs/day: 0.00    Years: 20.00    Pack years: 0.00    Types: Cigarettes  . Smokeless tobacco: Never Used  Substance and Sexual Activity  . Alcohol use: Not Currently  . Drug use: Yes    Types: Marijuana    Comment: occas  . Sexual activity: Never    Birth control/protection: None  Other Topics Concern  . Not on file  Social History Narrative  . Not on file   Social Determinants of Health   Financial Resource Strain:   . Difficulty of Paying Living Expenses:   Food Insecurity:   . Worried About Charity fundraiser in the Last Year:   . [employer]  of Food in the Last Year:   Transportation Needs:   . Film/video editor (Medical):   Marland Kitchen Lack of Transportation (Non-Medical):   Physical Activity:   . Days of Exercise per Week:   . Minutes of Exercise per Session:   Stress:   . Feeling of Stress :   Social Connections:   . Frequency of Communication with Friends and Family:   . Frequency of Social Gatherings with Friends and Family:   . Attends Religious Services:   . Active Member of Clubs or Organizations:   . Attends Archivist Meetings:   Marland Kitchen Marital Status:   Intimate Partner Violence:   . Fear of Current or Ex-Partner:   . Emotionally Abused:   Marland Kitchen Physically Abused:   . Sexually Abused:     Family History: Family History  Problem Relation Age of Onset  . Cancer Maternal Grandmother        ovarian    . Anxiety disorder Mother   . COPD Mother   . Hypertension Father   . Anxiety disorder Father   . COPD Father     Review of Systems: Review of Systems  Constitutional: Negative.   Respiratory: Negative.   Cardiovascular: Negative.   Gastrointestinal: Negative.   Genitourinary: Negative.   Neurological: Negative.     Physical Exam: Vital Signs BP (!) 110/59 (BP Location: Left Arm, Patient Position: Sitting, Cuff Size: Large)   Pulse (!) 107   Temp 98.3 F (36.8 C) (Temporal)   Ht 5\' 8"  (1.727 m)   Wt 174 lb 9.6 oz (79.2 kg)   SpO2 98%   BMI 26.55 kg/m  Physical Exam Constitutional:      General: She is not in acute distress.    Appearance: Normal appearance. She is not ill-appearing.  HENT:     Head: Normocephalic and atraumatic.  Eyes:     Pupils: Pupils are equal, round.  Cardiovascular:     Rate and Rhythm: Normal rate and regular rhythm.     Pulses: Normal pulses.     Heart sounds: Normal heart sounds. No murmur.  Pulmonary:     Effort: Pulmonary effort is normal. No respiratory distress.     Breath sounds: Normal breath sounds. No wheezing.  Abdominal:     General: Abdomen is flat. There is no distension.     Palpations: Abdomen is soft.     Tenderness: There is no abdominal tenderness.  Musculoskeletal: Normal range of motion.  Skin:    General: Skin is warm and dry.     Findings: No erythema or rash. ~ 12 cm lipoma of right mid back, mobile, soft, minimal TTP Neurological:     General: No focal deficit present.     Mental Status: She is alert and oriented to person, place, and time. Mental status is at baseline.     Motor: No weakness.  Psychiatric:        Mood and Affect: Mood normal.        Behavior: Behavior normal.    Assessment/Plan: Paula Sosa is scheduled for excision of right mid back lipoma with Dr. Claudia Desanctis.  Risks, benefits, and alternatives of procedure discussed, questions answered and consent obtained.    Smoking Status: Quit 8 months  ago; Counseling Given? NA Last Mammogram: NA; Results: NA  Caprini Score: 2; Risk Factors include:  BMI > 25, and length of planned surgery. Recommendation for mechanical prophylaxis during surgery. Encourage early ambulation.   Post-op Rx sent to pharmacy: norco,  zofran  Patient was provided with the General Surgical Risk consent document and Pain Medication Agreement prior to their appointment.  They had adequate time to read through the risk consent documents and Pain Medication Agreement. We also discussed them in person together during this preop appointment. All of their questions were answered to their satisfaction.  Recommended calling if they have any further questions.  Risk consent form and Pain Medication Agreement to be scanned into patient's chart.   Electronically signed by: Carola Rhine [place] Yeats, PA-C 02/16/2020 3:44 PM

## 2020-02-16 NOTE — H&P (View-Only) (Signed)
Patient ID: Paula Sosa, female    DOB: Dec 12, 1980, 39 y.o.   MRN: [id number]  Chief Complaint  Patient presents with  . Pre-op Exam      ICD-10-CM   1. Lipoma of back  D17.1      History of Present Illness: Paula Sosa is a 39 y.o.  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  She presents for preoperative evaluation for upcoming procedure, excision of back lipoma, scheduled for 02/22/20 with Dr. Claudia Desanctis  Patient reports history of postop nausea and vomiting.  Does not recall she is not a scopolamine patch or not. No history of DVT/PE.  No family history of DVT/PE.  No family or personal history of bleeding or clotting disorders.  Patient is not currently taking any blood thinners.  No history of CVA/MI.   Summary of Previous Visit: Patient has a lipoma on her right mid back that has been growing over the past few years, is approximately 12 cm and freely mobile.  It is soft.  She is interested in surgical removal.  PMH Significant for: Hx of MRSA, PONV, sensitive skin. Surgical Hx of lumbar disc surgery, ORIF metacarpal fx, carpal tunnel release,   Patient is a former smoker, reports quitting approximately 8 months ago.  Past Medical History: Allergies: Allergies  Allergen Reactions  . Prednisone Other (See Comments)    PARANOIA  . Penicillins Rash    Current Medications: No current outpatient medications on file.  Past Medical Problems: Past Medical History:  Diagnosis Date  . Carpal tunnel syndrome on right 11/2016  . History of kidney stones   . History of MRSA infection ~ 2003   tattoo on abd. became infected  . PONV (postoperative nausea and vomiting)   . Seasonal allergies   . Sensitive skin   . TMJ syndrome     Past Surgical History: Past Surgical History:  Procedure Laterality Date  . CARPAL TUNNEL RELEASE Right 11/21/2016   Procedure: RIGHT CARPAL TUNNEL RELEASE;  Surgeon: Daryll Brod, MD;  Location: [facility];  Service: Orthopedics;  Laterality: Right;  REG/FAB  . LUMBAR DISC SURGERY    . ORIF METACARPAL FRACTURE Left 07/24/2004   middle and ring  . THERAPEUTIC ABORTION    . TONSILLECTOMY      Social History: Social History   Socioeconomic History  . Marital status: Single    Spouse name: Not on file  . Number of children: Not on file  . Years of education: Not on file  . Highest education level: Not on file  Occupational History  . Not on file  Tobacco Use  . Smoking status: Current Some Day Smoker    Packs/day: 0.00    Years: 20.00    Pack years: 0.00    Types: Cigarettes  . Smokeless tobacco: Never Used  Substance and Sexual Activity  . Alcohol use: Not Currently  . Drug use: Yes    Types: Marijuana    Comment: occas  . Sexual activity: Never    Birth control/protection: None  Other Topics Concern  . Not on file  Social History Narrative  . Not on file   Social Determinants of Health   Financial Resource Strain:   . Difficulty of Paying Living Expenses:   Food Insecurity:   . Worried About Charity fundraiser in the Last Year:   . [employer]  of Food in the Last Year:   Transportation Needs:   . Film/video editor (Medical):   Marland Kitchen Lack of Transportation (Non-Medical):   Physical Activity:   . Days of Exercise per Week:   . Minutes of Exercise per Session:   Stress:   . Feeling of Stress :   Social Connections:   . Frequency of Communication with Friends and Family:   . Frequency of Social Gatherings with Friends and Family:   . Attends Religious Services:   . Active Member of Clubs or Organizations:   . Attends Archivist Meetings:   Marland Kitchen Marital Status:   Intimate Partner Violence:   . Fear of Current or Ex-Partner:   . Emotionally Abused:   Marland Kitchen Physically Abused:   . Sexually Abused:     Family History: Family History  Problem Relation Age of Onset  . Cancer Maternal Grandmother        ovarian    . Anxiety disorder Mother   . COPD Mother   . Hypertension Father   . Anxiety disorder Father   . COPD Father     Review of Systems: Review of Systems  Constitutional: Negative.   Respiratory: Negative.   Cardiovascular: Negative.   Gastrointestinal: Negative.   Genitourinary: Negative.   Neurological: Negative.     Physical Exam: Vital Signs BP (!) 110/59 (BP Location: Left Arm, Patient Position: Sitting, Cuff Size: Large)   Pulse (!) 107   Temp 98.3 F (36.8 C) (Temporal)   Ht 5\' 8"  (1.727 m)   Wt 174 lb 9.6 oz (79.2 kg)   SpO2 98%   BMI 26.55 kg/m  Physical Exam Constitutional:      General: She is not in acute distress.    Appearance: Normal appearance. She is not ill-appearing.  HENT:     Head: Normocephalic and atraumatic.  Eyes:     Pupils: Pupils are equal, round.  Cardiovascular:     Rate and Rhythm: Normal rate and regular rhythm.     Pulses: Normal pulses.     Heart sounds: Normal heart sounds. No murmur.  Pulmonary:     Effort: Pulmonary effort is normal. No respiratory distress.     Breath sounds: Normal breath sounds. No wheezing.  Abdominal:     General: Abdomen is flat. There is no distension.     Palpations: Abdomen is soft.     Tenderness: There is no abdominal tenderness.  Musculoskeletal: Normal range of motion.  Skin:    General: Skin is warm and dry.     Findings: No erythema or rash. ~ 12 cm lipoma of right mid back, mobile, soft, minimal TTP Neurological:     General: No focal deficit present.     Mental Status: She is alert and oriented to person, place, and time. Mental status is at baseline.     Motor: No weakness.  Psychiatric:        Mood and Affect: Mood normal.        Behavior: Behavior normal.    Assessment/Plan: [name] is scheduled for excision of right mid back lipoma with Dr. Claudia Desanctis.  Risks, benefits, and alternatives of procedure discussed, questions answered and consent obtained.    Smoking Status: Quit 8 months  ago; Counseling Given? NA Last Mammogram: NA; Results: NA  Caprini Score: 2; Risk Factors include:  BMI > 25, and length of planned surgery. Recommendation for mechanical prophylaxis during surgery. Encourage early ambulation.   Post-op Rx sent to pharmacy: norco,  zofran  Patient was provided with the General Surgical Risk consent document and Pain Medication Agreement prior to their appointment.  They had adequate time to read through the risk consent documents and Pain Medication Agreement. We also discussed them in person together during this preop appointment. All of their questions were answered to their satisfaction.  Recommended calling if they have any further questions.  Risk consent form and Pain Medication Agreement to be scanned into patient's chart.   Electronically signed by: Carola Rhine Hasna Stefanik, PA-C 02/16/2020 3:44 PM

## 2020-02-19 ENCOUNTER — Other Ambulatory Visit (HOSPITAL_COMMUNITY)
Admission: RE | Admit: 2020-02-19 | Discharge: 2020-02-19 | Disposition: A | Discharge status: Home / Self Care | Payer: BC Managed Care – PPO | Source: Ambulatory Visit | Attending: Plastic Surgery | Admitting: Plastic Surgery

## 2020-02-19 DIAGNOSIS — Z20822 Contact with and (suspected) exposure to covid-19: Secondary | ICD-10-CM | POA: Insufficient documentation

## 2020-02-19 DIAGNOSIS — Z01812 Encounter for preprocedural laboratory examination: Secondary | ICD-10-CM | POA: Diagnosis present

## 2020-02-19 LAB — SARS CORONAVIRUS 2 (TAT 6-24 HRS): SARS Coronavirus 2: NEGATIVE

## 2020-02-22 ENCOUNTER — Encounter (HOSPITAL_BASED_OUTPATIENT_CLINIC_OR_DEPARTMENT_OTHER)
Admission: RE | Disposition: A | Discharge status: Home / Self Care | Payer: Self-pay | Source: Home / Self Care | Attending: Plastic Surgery

## 2020-02-22 ENCOUNTER — Other Ambulatory Visit: Payer: Self-pay

## 2020-02-22 ENCOUNTER — Encounter (HOSPITAL_BASED_OUTPATIENT_CLINIC_OR_DEPARTMENT_OTHER): Payer: Self-pay | Admitting: Plastic Surgery

## 2020-02-22 ENCOUNTER — Ambulatory Visit (HOSPITAL_BASED_OUTPATIENT_CLINIC_OR_DEPARTMENT_OTHER): Payer: BC Managed Care – PPO | Admitting: Certified Registered"

## 2020-02-22 ENCOUNTER — Ambulatory Visit (HOSPITAL_BASED_OUTPATIENT_CLINIC_OR_DEPARTMENT_OTHER)
Admission: RE | Admit: 2020-02-22 | Discharge: 2020-02-22 | Disposition: A | Discharge status: Home / Self Care | Payer: BC Managed Care – PPO | Attending: Plastic Surgery | Admitting: Plastic Surgery

## 2020-02-22 DIAGNOSIS — F17210 Nicotine dependence, cigarettes, uncomplicated: Secondary | ICD-10-CM | POA: Diagnosis not present

## 2020-02-22 DIAGNOSIS — D171 Benign lipomatous neoplasm of skin and subcutaneous tissue of trunk: Secondary | ICD-10-CM | POA: Insufficient documentation

## 2020-02-22 HISTORY — PX: LIPOMA EXCISION: SHX5283

## 2020-02-22 LAB — POCT PREGNANCY, URINE: Preg Test, Ur: NEGATIVE

## 2020-02-22 SURGERY — EXCISION LIPOMA
Anesthesia: General | Site: Back | Laterality: Right

## 2020-02-22 MED ORDER — MIDAZOLAM HCL 2 MG/2ML IJ SOLN
INTRAMUSCULAR | Status: AC
  Filled 2020-02-22: qty 2

## 2020-02-22 MED ORDER — CLINDAMYCIN PHOSPHATE 900 MG/50ML IV SOLN
INTRAVENOUS | Status: AC
  Filled 2020-02-22: qty 50

## 2020-02-22 MED ORDER — LACTATED RINGERS IV SOLN: INTRAVENOUS | Status: DC | PRN

## 2020-02-22 MED ORDER — FENTANYL CITRATE (PF) 100 MCG/2ML IJ SOLN
INTRAMUSCULAR | Status: DC | PRN
  Administered 2020-02-22 (×2): 50 ug via INTRAVENOUS

## 2020-02-22 MED ORDER — CLINDAMYCIN PHOSPHATE 900 MG/50ML IV SOLN
900.0000 mg | INTRAVENOUS | Status: AC
  Administered 2020-02-22: 900 mg via INTRAVENOUS

## 2020-02-22 MED ORDER — ACETAMINOPHEN 500 MG PO TABS
ORAL_TABLET | ORAL | Status: AC
  Filled 2020-02-22: qty 2

## 2020-02-22 MED ORDER — ACETAMINOPHEN 500 MG PO TABS
1000.0000 mg | ORAL_TABLET | Freq: Once | ORAL | Status: AC
  Administered 2020-02-22: 1000 mg via ORAL

## 2020-02-22 MED ORDER — CELECOXIB 200 MG PO CAPS
200.0000 mg | ORAL_CAPSULE | Freq: Once | ORAL | Status: AC
  Administered 2020-02-22: 200 mg via ORAL

## 2020-02-22 MED ORDER — PROMETHAZINE HCL 25 MG/ML IJ SOLN: 6.2500 mg | INTRAMUSCULAR | Status: DC | PRN

## 2020-02-22 MED ORDER — SCOPOLAMINE 1 MG/3DAYS TD PT72
MEDICATED_PATCH | TRANSDERMAL | Status: AC
  Filled 2020-02-22: qty 1

## 2020-02-22 MED ORDER — PROPOFOL 500 MG/50ML IV EMUL
INTRAVENOUS | Status: DC | PRN
Start: 2020-02-22 — End: 2020-02-22
  Administered 2020-02-22: 25 ug/kg/min via INTRAVENOUS

## 2020-02-22 MED ORDER — ONDANSETRON HCL 4 MG/2ML IJ SOLN
INTRAMUSCULAR | Status: DC | PRN
  Administered 2020-02-22: 4 mg via INTRAVENOUS

## 2020-02-22 MED ORDER — LACTATED RINGERS IV SOLN
INTRAVENOUS | Status: DC | PRN
  Administered 2020-02-22: 200 mL

## 2020-02-22 MED ORDER — CELECOXIB 200 MG PO CAPS
ORAL_CAPSULE | ORAL | Status: AC
  Filled 2020-02-22: qty 1

## 2020-02-22 MED ORDER — PROPOFOL 10 MG/ML IV BOLUS
INTRAVENOUS | Status: DC | PRN
  Administered 2020-02-22: 200 mg via INTRAVENOUS

## 2020-02-22 MED ORDER — SCOPOLAMINE 1 MG/3DAYS TD PT72
1.0000 | MEDICATED_PATCH | TRANSDERMAL | Status: DC
  Administered 2020-02-22: 1.5 mg via TRANSDERMAL

## 2020-02-22 MED ORDER — FENTANYL CITRATE (PF) 100 MCG/2ML IJ SOLN
INTRAMUSCULAR | Status: AC
  Filled 2020-02-22: qty 2

## 2020-02-22 MED ORDER — LIDOCAINE HCL (CARDIAC) PF 100 MG/5ML IV SOSY
PREFILLED_SYRINGE | INTRAVENOUS | Status: DC | PRN
  Administered 2020-02-22: 80 mg via INTRAVENOUS

## 2020-02-22 MED ORDER — FENTANYL CITRATE (PF) 100 MCG/2ML IJ SOLN: 25.0000 ug | INTRAMUSCULAR | Status: DC | PRN

## 2020-02-22 MED ORDER — MIDAZOLAM HCL 5 MG/5ML IJ SOLN
INTRAMUSCULAR | Status: DC | PRN
  Administered 2020-02-22: 2 mg via INTRAVENOUS

## 2020-02-22 MED ORDER — LACTATED RINGERS IV SOLN: INTRAVENOUS | Status: DC

## 2020-02-22 SURGICAL SUPPLY — 77 items
ADH SKN CLS APL DERMABOND .7 (GAUZE/BANDAGES/DRESSINGS)
APL PRP STRL LF DISP 70% ISPRP (MISCELLANEOUS) ×1
APL SKNCLS STERI-STRIP NONHPOA (GAUZE/BANDAGES/DRESSINGS) ×1
BAND INSRT 18 STRL LF DISP RB (MISCELLANEOUS)
BAND RUBBER #18 3X1/16 STRL (MISCELLANEOUS) IMPLANT
BENZOIN TINCTURE PRP APPL 2/3 (GAUZE/BANDAGES/DRESSINGS) ×2 IMPLANT
BLADE CLIPPER SURG (BLADE) IMPLANT
BLADE SURG 15 STRL LF DISP TIS (BLADE) ×1 IMPLANT
BLADE SURG 15 STRL SS (BLADE) ×3
CANISTER SUCT 1200ML W/VALVE (MISCELLANEOUS) IMPLANT
CHLORAPREP W/TINT 26 (MISCELLANEOUS) ×3 IMPLANT
CLOSURE WOUND 1/2 X4 (GAUZE/BANDAGES/DRESSINGS) ×1
COVER BACK TABLE 60X90IN (DRAPES) ×3 IMPLANT
COVER MAYO STAND STRL (DRAPES) ×3 IMPLANT
COVER WAND RF STERILE (DRAPES) IMPLANT
DERMABOND ADVANCED (GAUZE/BANDAGES/DRESSINGS)
DERMABOND ADVANCED .7 DNX12 (GAUZE/BANDAGES/DRESSINGS) IMPLANT
DRAIN JP 10F RND SILICONE (MISCELLANEOUS) IMPLANT
DRAPE LAPAROTOMY 100X72 PEDS (DRAPES) ×2 IMPLANT
DRAPE U-SHAPE 76X120 STRL (DRAPES) IMPLANT
DRAPE UTILITY XL STRL (DRAPES) ×3 IMPLANT
DRSG MEPILEX BORDER 4X4 (GAUZE/BANDAGES/DRESSINGS) ×2 IMPLANT
DRSG TELFA 3X8 NADH (GAUZE/BANDAGES/DRESSINGS) IMPLANT
ELECT COATED BLADE 2.86 ST (ELECTRODE) ×3 IMPLANT
ELECT NDL BLADE 2-5/6 (NEEDLE) ×1 IMPLANT
ELECT NEEDLE BLADE 2-5/6 (NEEDLE) ×3 IMPLANT
ELECT REM PT RETURN 9FT ADLT (ELECTROSURGICAL) ×3
ELECT REM PT RETURN 9FT PED (ELECTROSURGICAL)
ELECTRODE REM PT RETRN 9FT PED (ELECTROSURGICAL) IMPLANT
ELECTRODE REM PT RTRN 9FT ADLT (ELECTROSURGICAL) ×1 IMPLANT
EVACUATOR SILICONE 100CC (DRAIN) IMPLANT
GAUZE SPONGE 4X4 12PLY STRL LF (GAUZE/BANDAGES/DRESSINGS) IMPLANT
GAUZE XEROFORM 1X8 LF (GAUZE/BANDAGES/DRESSINGS) IMPLANT
GLOVE BIOGEL M STRL SZ7.5 (GLOVE) ×6 IMPLANT
GLOVE BIOGEL PI IND STRL 7.0 (GLOVE) IMPLANT
GLOVE BIOGEL PI IND STRL 8 (GLOVE) ×2 IMPLANT
GLOVE BIOGEL PI INDICATOR 7.0 (GLOVE) ×4
GLOVE BIOGEL PI INDICATOR 8 (GLOVE) ×4
GLOVE ECLIPSE 7.0 STRL STRAW (GLOVE) ×2 IMPLANT
GOWN STRL REUS W/ TWL LRG LVL3 (GOWN DISPOSABLE) ×3 IMPLANT
GOWN STRL REUS W/TWL LRG LVL3 (GOWN DISPOSABLE) ×9
NDL PRECISIONGLIDE 27X1.5 (NEEDLE) ×1 IMPLANT
NDL SPNL 18GX3.5 QUINCKE PK (NEEDLE) IMPLANT
NEEDLE HYPO 30GX1 BEV (NEEDLE) IMPLANT
NEEDLE PRECISIONGLIDE 27X1.5 (NEEDLE) ×3 IMPLANT
NEEDLE SPNL 18GX3.5 QUINCKE PK (NEEDLE) ×3 IMPLANT
NS IRRIG 1000ML POUR BTL (IV SOLUTION) ×2 IMPLANT
PACK BASIN DAY SURGERY FS (CUSTOM PROCEDURE TRAY) ×3 IMPLANT
PENCIL SMOKE EVACUATOR (MISCELLANEOUS) ×3 IMPLANT
SHEET MEDIUM DRAPE 40X70 STRL (DRAPES) IMPLANT
SLEEVE SCD COMPRESS KNEE MED (MISCELLANEOUS) ×2 IMPLANT
SPONGE GAUZE 2X2 8PLY STER LF (GAUZE/BANDAGES/DRESSINGS)
SPONGE GAUZE 2X2 8PLY STRL LF (GAUZE/BANDAGES/DRESSINGS) IMPLANT
SPONGE LAP 18X18 RF (DISPOSABLE) ×2 IMPLANT
STAPLER VISISTAT 35W (STAPLE) ×3 IMPLANT
STRIP CLOSURE SKIN 1/2X4 (GAUZE/BANDAGES/DRESSINGS) ×2 IMPLANT
SUCTION FRAZIER HANDLE 10FR (MISCELLANEOUS)
SUCTION TUBE FRAZIER 10FR DISP (MISCELLANEOUS) IMPLANT
SUT ETHILON 4 0 PS 2 18 (SUTURE) IMPLANT
SUT MNCRL AB 4-0 PS2 18 (SUTURE) IMPLANT
SUT MON AB 5-0 P3 18 (SUTURE) IMPLANT
SUT PDS 3-0 CT2 (SUTURE) ×3
SUT PDS II 3-0 CT2 27 ABS (SUTURE) IMPLANT
SUT PLAIN 5 0 P 3 18 (SUTURE) IMPLANT
SUT PROLENE 5 0 P 3 (SUTURE) IMPLANT
SUT VICRYL 4-0 PS2 18IN ABS (SUTURE) IMPLANT
SUT VLOC 90 P-14 23 (SUTURE) ×2 IMPLANT
SWAB COLLECTION DEVICE MRSA (MISCELLANEOUS) IMPLANT
SWAB CULTURE ESWAB REG 1ML (MISCELLANEOUS) IMPLANT
SYR 3ML 23GX1 SAFETY (SYRINGE) ×3 IMPLANT
SYR 50ML LL SCALE MARK (SYRINGE) ×3 IMPLANT
SYR BULB EAR ULCER 3OZ GRN STR (SYRINGE) ×3 IMPLANT
SYR CONTROL 10ML LL (SYRINGE) ×3 IMPLANT
TOWEL GREEN STERILE FF (TOWEL DISPOSABLE) ×3 IMPLANT
TRAY DSU PREP LF (CUSTOM PROCEDURE TRAY) IMPLANT
TUBE CONNECTING 20'X1/4 (TUBING)
TUBE CONNECTING 20X1/4 (TUBING) IMPLANT

## 2020-02-22 NOTE — Discharge Instructions (Addendum)
Activity As tolerated:  NO driving No heavy activities  Diet: Regular  Wound Care: Keep dressing clean & dry. You can shower after 24 hours (1pm on 02/23/20)  Do not change dressings. If dressing falls off, you may cover with 4x4 gauze/tape or leave uncovered with steri-strip in place.  Call Doctor if any unusual problems occur such as pain, excessive Bleeding, unrelieved Nausea/vomiting, Fever &/or chills  Follow-up appointment: Scheduled for next week.  No Tylenol until 3:00pm No Ibuprofen until 5:00pm   Post Anesthesia Home Care Instructions  Activity: Get plenty of rest for the remainder of the day. A responsible individual must stay with you for 24 hours following the procedure.  For the next 24 hours, DO NOT: -Drive a car -Paediatric nurse -Drink alcoholic beverages -Take any medication unless instructed by your physician -Make any legal decisions or sign important papers.  Meals: Start with liquid foods such as gelatin or soup. Progress to regular foods as tolerated. Avoid greasy, spicy, heavy foods. If nausea and/or vomiting occur, drink only clear liquids until the nausea and/or vomiting subsides. Call your physician if vomiting continues.  Special Instructions/Symptoms: Your throat may feel dry or sore from the anesthesia or the breathing tube placed in your throat during surgery. If this causes discomfort, gargle with warm salt water. The discomfort should disappear within 24 hours.  If you had a scopolamine patch placed behind your ear for the management of post- operative nausea and/or vomiting:  1. The medication in the patch is effective for 72 hours, after which it should be removed.  Wrap patch in a tissue and discard in the trash. Wash hands thoroughly with soap and water. 2. You may remove the patch earlier than 72 hours if you experience unpleasant side effects which may include dry mouth, dizziness or visual disturbances. 3. Avoid touching the patch. Wash  your hands with soap and water after contact with the patch.

## 2020-02-22 NOTE — Interval H&P Note (Signed)
History and Physical Interval Note:  02/22/2020 10:07 AM  Paula Sosa  has presented today for surgery, with the diagnosis of lipom of back.  The various methods of treatment have been discussed with the patient and family. After consideration of risks, benefits and other options for treatment, the patient has consented to  Procedure(s) with comments: EXCISION LIPOMA (Right) - 45 min as a surgical intervention.  The patient's history has been reviewed, patient examined, no change in status, stable for surgery.  I have reviewed the patient's chart and labs.  Questions were answered to the patient's satisfaction.     [name]

## 2020-02-22 NOTE — Transfer of Care (Signed)
Immediate Anesthesia Transfer of Care Note  Patient: LILLEY HUBBLE  Procedure(s) Performed: EXCISION LIPOMA (Right Back)  Patient Location: PACU  Anesthesia Type:General  Level of Consciousness: drowsy and patient cooperative  Airway & Oxygen Therapy: Patient Spontanous Breathing and Patient connected to face mask oxygen  Post-op Assessment: Report given to RN and Post -op Vital signs reviewed and stable  Post vital signs: Reviewed and stable  Last Vitals:  Vitals Value Taken Time  BP 107/83 02/22/20 1221  Temp    Pulse 71 02/22/20 1223  Resp 10 02/22/20 1223  SpO2 100 % 02/22/20 1223  Vitals shown include unvalidated device data.  Last Pain:  Vitals:   02/22/20 0910  TempSrc: Oral  PainSc: 0-No pain         Complications: No complications documented.

## 2020-02-22 NOTE — Anesthesia Preprocedure Evaluation (Addendum)
Anesthesia Evaluation  Patient identified by MRN, date of birth, ID band Patient awake    Reviewed: Allergy & Precautions, NPO status , Patient's Chart, lab work & pertinent test results  History of Anesthesia Complications (+) PONV and history of anesthetic complications  Airway Mallampati: II  TM Distance: >3 FB Neck ROM: Full    Dental no notable dental hx. (+) Dental Advisory Given   Pulmonary Current Smoker and Patient abstained from smoking.,    Pulmonary exam normal        Cardiovascular negative cardio ROS Normal cardiovascular exam     Neuro/Psych PSYCHIATRIC DISORDERS Depression negative neurological ROS     GI/Hepatic negative GI ROS, Neg liver ROS,   Endo/Other  negative endocrine ROS  Renal/GU negative Renal ROS     Musculoskeletal negative musculoskeletal ROS (+)   Abdominal   Peds  Hematology negative hematology ROS (+)   Anesthesia Other Findings   Reproductive/Obstetrics                            Anesthesia Physical Anesthesia Plan  ASA: II  Anesthesia Plan: General   Post-op Pain Management:    Induction: Intravenous  PONV Risk Score and Plan: 3 and Ondansetron, Dexamethasone and Scopolamine patch - Pre-op  Airway Management Planned: LMA and Oral ETT  Additional Equipment:   Intra-op Plan:   Post-operative Plan: Extubation in OR  Informed Consent: I have reviewed the patients History and Physical, chart, labs and discussed the procedure including the risks, benefits and alternatives for the proposed anesthesia with the patient or authorized representative who has indicated his/her understanding and acceptance.     Dental advisory given  Plan Discussed with: Anesthesiologist and CRNA  Anesthesia Plan Comments:        Anesthesia Quick Evaluation

## 2020-02-22 NOTE — Brief Op Note (Signed)
02/22/2020  12:16 PM  PATIENT:  Paula Sosa  39 y.o. female  PRE-OPERATIVE DIAGNOSIS:  lipom of back  POST-OPERATIVE DIAGNOSIS:  * No post-op diagnosis entered *  PROCEDURE:  Procedure(s) with comments: EXCISION LIPOMA (Right) - 45 min  SURGEON:  Surgeon(s) and Role:    * Dejanay Wamboldt, Steffanie Dunn, MD - Primary  PHYSICIAN ASSISTANT: Software engineer, PA  ASSISTANTS: none   ANESTHESIA:   general  EBL:  10   BLOOD ADMINISTERED:none  DRAINS: none   LOCAL MEDICATIONS USED:  MARCAINE     SPECIMEN:  Source of Specimen:  right back lipoma  DISPOSITION OF SPECIMEN:  PATHOLOGY  COUNTS:  YES  TOURNIQUET:  * No tourniquets in log *  DICTATION: .Dragon Dictation  PLAN OF CARE: Discharge to home after PACU  PATIENT DISPOSITION:  PACU - hemodynamically stable.   Delay start of Pharmacological VTE agent (>24hrs) due to surgical blood loss or risk of bleeding: not applicable

## 2020-02-22 NOTE — Op Note (Signed)
Operative Note   DATE OF OPERATION: 02/22/2020  SURGICAL DEPARTMENT: Plastic Surgery  PREOPERATIVE DIAGNOSES: Right back lipoma  POSTOPERATIVE DIAGNOSES:  same  PROCEDURE: 1.  Excision of 8 cm submuscular right back lipoma 2.  Complex closure right back 8 cm  SURGEON: Talmadge Coventry, MD  ASSISTANT: Verdie Shire, PA The advanced practice practitioner (APP) assisted throughout the case.  The APP was essential in retraction and counter traction when needed to make the case progress smoothly.  This retraction and assistance made it possible to see the tissue plans for the procedure.  The assistance was needed for blood control, tissue re-approximation and assisted with closure of the incision site.  ANESTHESIA:  General.   COMPLICATIONS: None.   INDICATIONS FOR PROCEDURE:  The patient, Paula Sosa is a 39 y.o. female born on 1980/10/01, is here for treatment of right back lipoma MRN: [id number]  CONSENT:  Informed consent was obtained directly from the patient. Risks, benefits and alternatives were fully discussed. Specific risks including but not limited to bleeding, infection, hematoma, seroma, scarring, pain, contracture, asymmetry, wound healing problems, and need for further surgery were all discussed. The patient did have an ample opportunity to have questions answered to satisfaction.   DESCRIPTION OF PROCEDURE:  The patient was taken to the operating room. SCDs were placed and Ancef antibiotics were given.  General anesthesia was administered.  The patient's operative site was prepped and draped in a sterile fashion. A time out was performed and all information was confirmed to be correct.  I started by infiltrating the area with local anesthetic with epinephrine.  I then made an oblique incision over the mass.  I dissected down to it with cautery.  It was just under the lateral border of the latissimus.  It was dissected free with a combination of cautery and tenotomy  dissection.  The mass was excised in its entirety.  Hemostasis was obtained with cautery.  The surrounding skin was then undermined and advanced and closed with interrupted buried 3-0 PDS sutures and a running 3-0 subcuticular V lock suture.  Steri-Strips and a soft dressing were then applied.  The patient tolerated the procedure well.  There were no complications. The patient was allowed to wake from anesthesia, extubated and taken to the recovery room in satisfactory condition.

## 2020-02-22 NOTE — Anesthesia Postprocedure Evaluation (Signed)
Anesthesia Post Note  Patient: Paula Sosa  Procedure(s) Performed: EXCISION LIPOMA (Right Back)     Patient location during evaluation: PACU Anesthesia Type: General Level of consciousness: sedated Pain management: pain level controlled Vital Signs Assessment: post-procedure vital signs reviewed and stable Respiratory status: spontaneous breathing and respiratory function stable Cardiovascular status: stable Postop Assessment: no apparent nausea or vomiting Anesthetic complications: no   No complications documented.  Last Vitals:  Vitals:   02/22/20 1245 02/22/20 1311  BP: 125/87 (!) 135/91  Pulse: 89 69  Resp: 16 18  Temp:  36.4 C  SpO2: 100% 97%    Last Pain:  Vitals:   02/22/20 1311  TempSrc: Axillary  PainSc: 0-No pain                 Rockwell Zentz DANIEL

## 2020-02-22 NOTE — Anesthesia Procedure Notes (Signed)
Procedure Name: LMA Insertion Date/Time: 02/22/2020 11:43 AM Performed by: Signe Colt, CRNA Pre-anesthesia Checklist: Patient identified, Emergency Drugs available, Suction available and Patient being monitored Patient Re-evaluated:Patient Re-evaluated prior to induction Oxygen Delivery Method: Circle system utilized Preoxygenation: Pre-oxygenation with 100% oxygen Induction Type: IV induction Ventilation: Mask ventilation without difficulty LMA: LMA inserted LMA Size: 4.0 Number of attempts: 1 Airway Equipment and Method: Bite block Placement Confirmation: positive ETCO2 Tube secured with: Tape Dental Injury: Teeth and Oropharynx as per pre-operative assessment

## 2020-02-23 ENCOUNTER — Encounter (HOSPITAL_BASED_OUTPATIENT_CLINIC_OR_DEPARTMENT_OTHER): Payer: Self-pay | Admitting: Plastic Surgery

## 2020-02-23 LAB — SURGICAL PATHOLOGY

## 2020-03-08 ENCOUNTER — Encounter: Payer: BC Managed Care – PPO | Admitting: Plastic Surgery

## 2020-07-03 ENCOUNTER — Other Ambulatory Visit: Payer: BC Managed Care – PPO

## 2021-02-14 IMAGING — US US BREAST*L* LIMITED INC AXILLA
1 series · 11 of 11 positions shown · non-contrast
Comparison: Baseline screening mammogram dated 11/30/2019.

CLINICAL DATA: Patient was called back from screening mammogram for
a possible asymmetry in the left breast.

EXAM:
DIGITAL DIAGNOSTIC LEFT MAMMOGRAM WITH CAD AND TOMO
ULTRASOUND LEFT BREAST

[Series 1: us breast*left* limited inc axilla · 0.06mm/px · 11 of 11 slices shown]
[im 1/11]
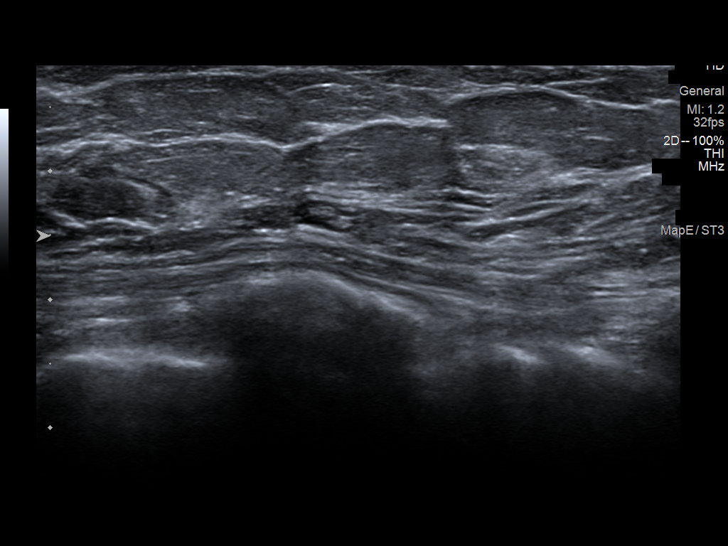
[im 2/11]
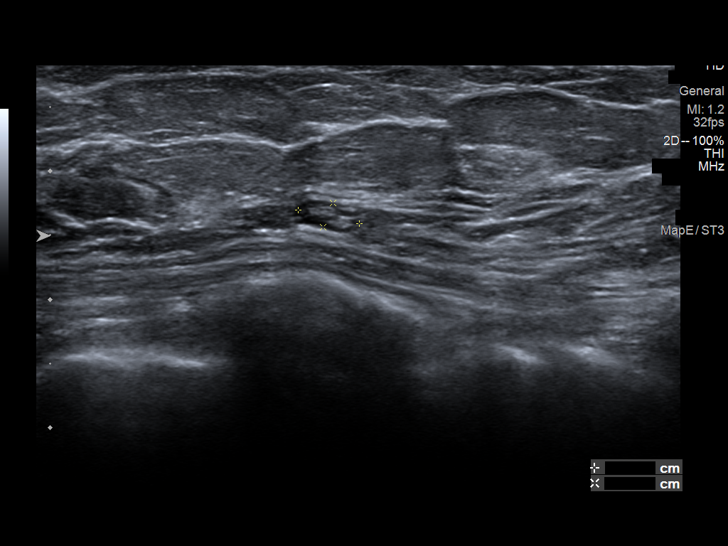
[im 3/11]
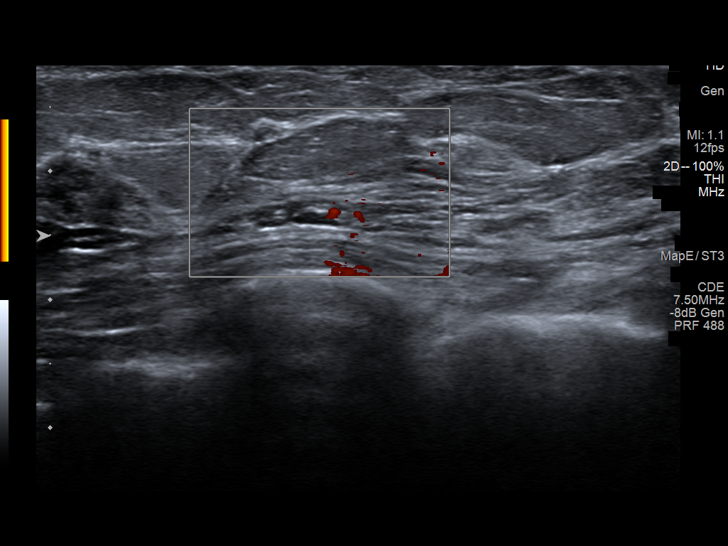
[im 4/11]
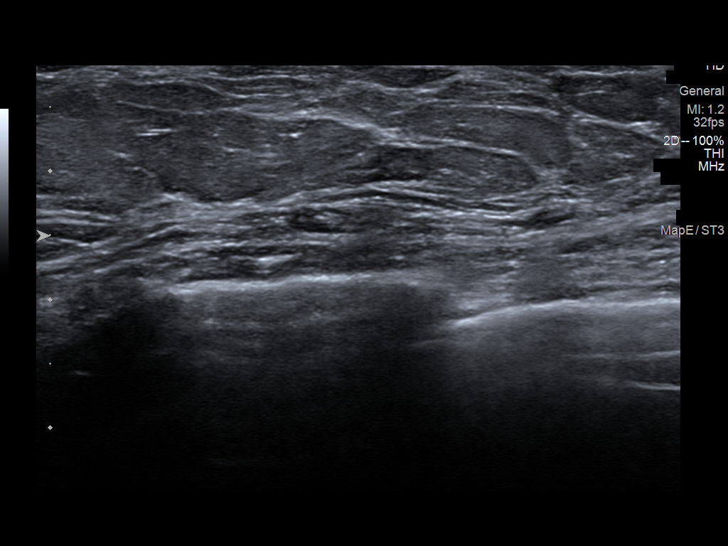
[im 5/11]
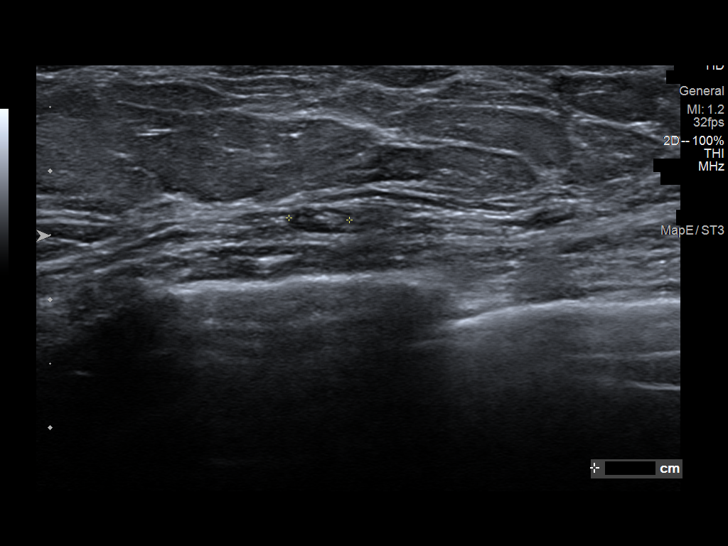
[im 6/11]
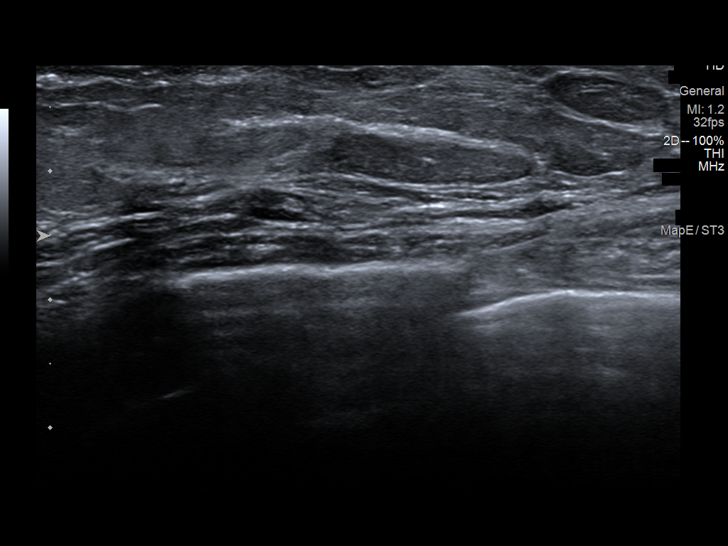
[im 7/11]
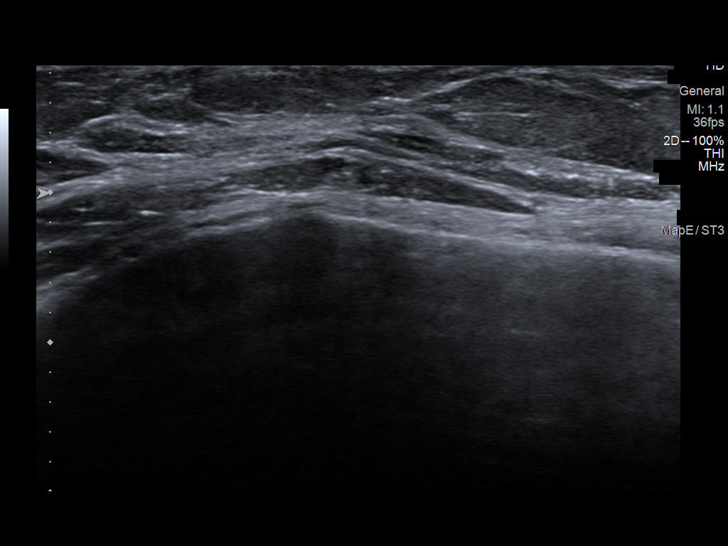
[im 8/11]
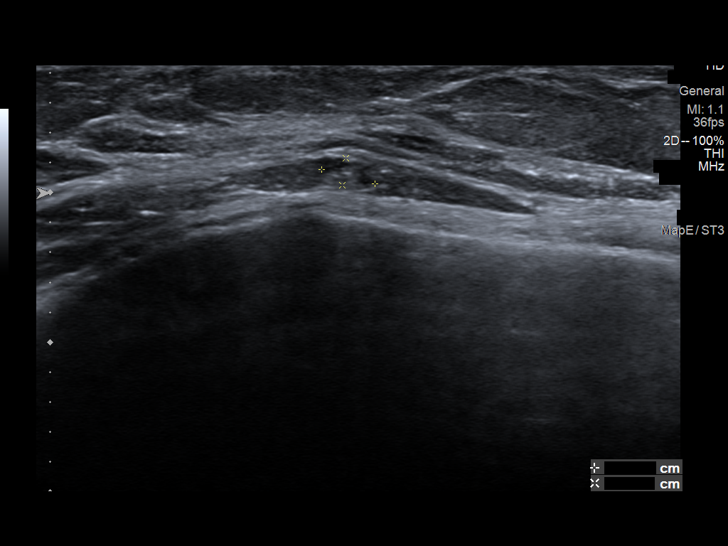
[im 9/11]
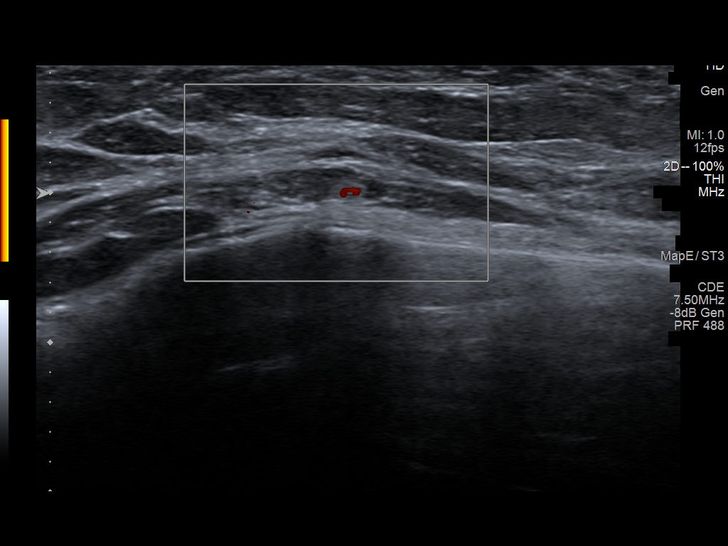
[im 10/11]
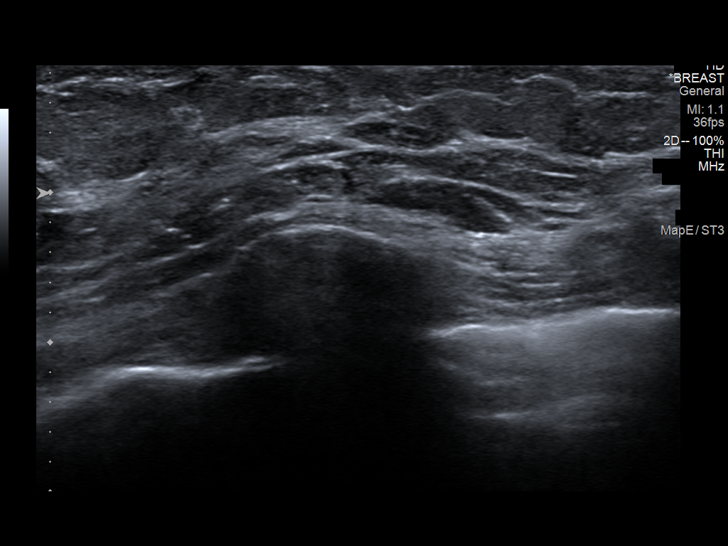
[im 11/11]
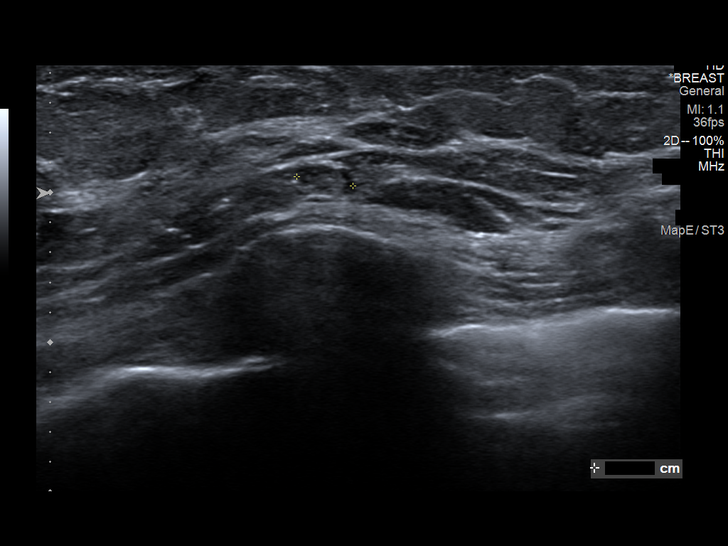

[11 of 11 positions shown; findings below may reference images not displayed]

ACR Breast Density Category c: The breast tissue is heterogeneously
dense, which may obscure small masses.
FINDINGS: Additional imaging of the left breast was performed. In the far
posterior aspect of the left breast there are 2 masses each
measuring 5 mm located in the upper outer and lateral aspect of the
breast. These masses are likely intramammary lymph nodes. There are
no malignant type microcalcifications.

Mammographic images were processed with CAD.

Targeted ultrasound is performed, showing a benign appearing lymph
node in the left breast at [DATE] 3 cm from the nipple measuring 5 x 2
x 5 mm. There is also a benign appearing lymph node in the left
breast at [DATE] 5 cm from the nipple measuring 4 x 2 x 4 mm. No
additional mass is identified in the lateral aspect of the left
breast.
IMPRESSION: Probable benign masses in the left breast, likely intramammary lymph
nodes.

RECOMMENDATION:
Short-term interval follow-up left mammogram and possible ultrasound
in 6 months is recommended.

I have discussed the findings and recommendations with the patient.
If applicable, a reminder letter will be sent to the patient
regarding the next appointment.

BI-RADS CATEGORY  3: Probably benign.

## 2021-12-14 ENCOUNTER — Other Ambulatory Visit: Payer: Self-pay | Admitting: Obstetrics & Gynecology

## 2021-12-14 DIAGNOSIS — N6320 Unspecified lump in the left breast, unspecified quadrant: Secondary | ICD-10-CM
